# Patient Record
Sex: Female | Born: 1990 | Race: Black or African American | Hispanic: No | Marital: Married | Smoking: Current some day smoker
Health system: Southern US, Community
[De-identification: ages and names within clinical notes are randomized; demographics above are authoritative.]

## PROBLEM LIST (undated history)

## (undated) DIAGNOSIS — Z789 Other specified health status: Secondary | ICD-10-CM

## (undated) HISTORY — PX: NO PAST SURGERIES: SHX2092

---
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
## 2012-12-26 ENCOUNTER — Emergency Department: Payer: Self-pay | Admitting: Emergency Medicine

## 2013-08-03 ENCOUNTER — Emergency Department: Payer: Self-pay | Admitting: Emergency Medicine

## 2014-04-07 NOTE — L&D Delivery Note (Signed)
Delivery Note At 11:14 AM a viable female was delivered via Vaginal, Spontaneous Delivery (Presentation: Right Occiput Anterior).  APGAR: 9, 9; weight 5 lb 7.8 oz (2490 g).   Placenta status: Spontaneous.  Cord: 3 vessels with the following complications: None.    Anesthesia: Epidural  Episiotomy: None Lacerations:  None Suture Repair: None Est. Blood Loss (mL): 150  Mom to postpartum.  Baby to Couplet care / Skin to Skin.  Heatherly Stenner 08/26/2014, 4:15 AM

## 2014-06-29 ENCOUNTER — Emergency Department: Payer: Self-pay | Admitting: Emergency Medicine

## 2014-06-29 LAB — ED INFLUENZA
Influenza A By PCR: POSITIVE
Influenza B By PCR: NEGATIVE

## 2014-07-04 LAB — OB RESULTS CONSOLE GC/CHLAMYDIA
CHLAMYDIA, DNA PROBE: NEGATIVE
GC PROBE AMP, GENITAL: NEGATIVE

## 2014-07-04 LAB — OB RESULTS CONSOLE HEPATITIS B SURFACE ANTIGEN: HEP B S AG: NEGATIVE

## 2014-07-04 LAB — OB RESULTS CONSOLE ABO/RH

## 2014-07-04 LAB — OB RESULTS CONSOLE HIV ANTIBODY (ROUTINE TESTING): HIV: NONREACTIVE

## 2014-07-04 LAB — OB RESULTS CONSOLE VARICELLA ZOSTER ANTIBODY, IGG: Varicella: IMMUNE

## 2014-07-04 LAB — OB RESULTS CONSOLE RPR: RPR: NONREACTIVE

## 2014-07-04 LAB — OB RESULTS CONSOLE RUBELLA ANTIBODY, IGM: RUBELLA: IMMUNE

## 2014-08-24 ENCOUNTER — Inpatient Hospital Stay
Admission: EM | Admit: 2014-08-24 | Discharge: 2014-08-27 | DRG: 775 | Disposition: A | Discharge status: Home / Self Care | Payer: 59 | Attending: Obstetrics and Gynecology | Admitting: Obstetrics and Gynecology

## 2014-08-24 ENCOUNTER — Encounter: Payer: Self-pay | Admitting: *Deleted

## 2014-08-24 DIAGNOSIS — O42913 Preterm premature rupture of membranes, unspecified as to length of time between rupture and onset of labor, third trimester: Secondary | ICD-10-CM | POA: Diagnosis present

## 2014-08-24 DIAGNOSIS — O0933 Supervision of pregnancy with insufficient antenatal care, third trimester: Secondary | ICD-10-CM | POA: Diagnosis present

## 2014-08-24 DIAGNOSIS — O42013 Preterm premature rupture of membranes, onset of labor within 24 hours of rupture, third trimester: Principal | ICD-10-CM | POA: Diagnosis present

## 2014-08-24 DIAGNOSIS — D649 Anemia, unspecified: Secondary | ICD-10-CM | POA: Diagnosis not present

## 2014-08-24 DIAGNOSIS — Z3A36 36 weeks gestation of pregnancy: Secondary | ICD-10-CM | POA: Diagnosis present

## 2014-08-24 LAB — CBC
HCT: 28.4 % — ABNORMAL LOW (ref 35.0–47.0)
Hemoglobin: 9 g/dL — ABNORMAL LOW (ref 12.0–16.0)
MCH: 22.4 pg — ABNORMAL LOW (ref 26.0–34.0)
MCHC: 31.8 g/dL — ABNORMAL LOW (ref 32.0–36.0)
MCV: 70.4 fL — AB (ref 80.0–100.0)
PLATELETS: 233 10*3/uL (ref 150–440)
RBC: 4.04 MIL/uL (ref 3.80–5.20)
RDW: 14.7 % — ABNORMAL HIGH (ref 11.5–14.5)
WBC: 9.1 10*3/uL (ref 3.6–11.0)

## 2014-08-24 LAB — TYPE AND SCREEN
ABO/RH(D): O POS
Antibody Screen: NEGATIVE

## 2014-08-24 LAB — ABO/RH: ABO/RH(D): O POS

## 2014-08-24 MED ORDER — BUTORPHANOL TARTRATE 1 MG/ML IJ SOLN
1.0000 mg | INTRAMUSCULAR | Status: DC | PRN
  Administered 2014-08-24 – 2014-08-25 (×3): 1 mg via INTRAVENOUS

## 2014-08-24 MED ORDER — SODIUM CHLORIDE 0.9 % IV SOLN
2.0000 g | Freq: Once | INTRAVENOUS | Status: AC
  Administered 2014-08-24: 2 g via INTRAVENOUS

## 2014-08-24 MED ORDER — SODIUM CHLORIDE 0.9 % IV SOLN
INTRAVENOUS | Status: AC
  Administered 2014-08-24: 1 g via INTRAVENOUS
  Filled 2014-08-24: qty 1000

## 2014-08-24 MED ORDER — BUTORPHANOL TARTRATE 1 MG/ML IJ SOLN
INTRAMUSCULAR | Status: AC
  Administered 2014-08-24: 1 mg via INTRAVENOUS
  Filled 2014-08-24: qty 1

## 2014-08-24 MED ORDER — ONDANSETRON HCL 4 MG/2ML IJ SOLN: 4.0000 mg | Freq: Four times a day (QID) | INTRAMUSCULAR | Status: DC | PRN

## 2014-08-24 MED ORDER — LACTATED RINGERS IV SOLN
500.0000 mL | INTRAVENOUS | Status: DC | PRN
  Administered 2014-08-25: 500 mL via INTRAVENOUS

## 2014-08-24 MED ORDER — TERBUTALINE SULFATE 1 MG/ML IJ SOLN: 0.2500 mg | Freq: Once | INTRAMUSCULAR | Status: AC | PRN

## 2014-08-24 MED ORDER — SODIUM CHLORIDE 0.9 % IV SOLN
1.0000 g | INTRAVENOUS | Status: DC
  Administered 2014-08-24 – 2014-08-25 (×4): 1 g via INTRAVENOUS
  Filled 2014-08-24 (×6): qty 1000

## 2014-08-24 MED ORDER — OXYTOCIN 40 UNITS IN LACTATED RINGERS INFUSION - SIMPLE MED
INTRAVENOUS | Status: AC
  Administered 2014-08-24: 1 m[IU]/min via INTRAVENOUS
  Filled 2014-08-24: qty 1000

## 2014-08-24 MED ORDER — LACTATED RINGERS IV SOLN
INTRAVENOUS | Status: DC
  Administered 2014-08-24 (×2): via INTRAVENOUS
  Administered 2014-08-25: 1 mL via INTRAVENOUS
  Administered 2014-08-25: 11:00:00 via INTRAVENOUS

## 2014-08-24 MED ORDER — OXYTOCIN 40 UNITS IN LACTATED RINGERS INFUSION - SIMPLE MED
1.0000 m[IU]/min | INTRAVENOUS | Status: DC
  Administered 2014-08-24: 1 m[IU]/min via INTRAVENOUS

## 2014-08-24 MED ORDER — ACETAMINOPHEN 325 MG PO TABS: 650.0000 mg | ORAL_TABLET | ORAL | Status: DC | PRN

## 2014-08-24 MED ORDER — SODIUM CHLORIDE 0.9 % IV SOLN
INTRAVENOUS | Status: AC
  Administered 2014-08-24: 2 g via INTRAVENOUS
  Filled 2014-08-24: qty 2000

## 2014-08-24 MED ORDER — LIDOCAINE HCL (PF) 1 % IJ SOLN
30.0000 mL | INTRAMUSCULAR | Status: DC | PRN
  Filled 2014-08-24: qty 30

## 2014-08-24 MED ORDER — SODIUM CHLORIDE 0.9 % IV SOLN
INTRAVENOUS | Status: AC
  Filled 2014-08-24: qty 1000

## 2014-08-24 MED ORDER — SODIUM CHLORIDE 0.9 % IV SOLN: 1.0000 g | Freq: Four times a day (QID) | INTRAVENOUS | Status: DC

## 2014-08-24 NOTE — H&P (Signed)
OB History and Physical   HPI: [name] is a 24 y.o. G2P1001 at 6351w1d, unsure LMP (dated by 6729 week sono), with EDD of 09/20/2014, who presents with complaints of LOF since 10:00 a.m. This morning.  Denies contractions, vaginal bleeding. Notes good FM.   OBHx:  G1 - 2012.  SVD at term, female infant ~ [redacted] weeks gestation. No complications during labor or delivery.  G2 - current.  [facility] (began at ~ [redacted] weeks gestation).  Mild anemia in pregnancy. Cervix slightly shortened, without funneling.  GYNHx: no h/o abnormal paps, STIs . PMH:  History reviewed. No pertinent past medical history.  SurgHx:  History reviewed. No pertinent past surgical history.  Family Hx:  History reviewed. No pertinent family history.  SocialHx:  History   Social History  . Marital Status: Single    Spouse Name: N/A  . Number of Children: N/A  . Years of Education: N/A   Occupational History  . Not on file.   Social History Main Topics  . Smoking status: Former Smoker    Quit date: 06/24/2014  . Smokeless tobacco: Not on file  . Alcohol Use: No  . Drug Use: Yes    Special: Marijuana  . Sexual Activity: Yes   Other Topics Concern  . Not on file   Social History Narrative  . No narrative on file    Allergies:  No Known Allergies  Medications:  PNV 1 tab PO daily Ferrous sulfate 1 tab PO daily.  Physical Exam:  Blood pressure 120/67, pulse 91, temperature 98.1 F (36.7 C), temperature source Oral, resp. rate 16, height 5\' 5"  (1.651 m), weight 66.225 kg (146 lb).  Physical Exam  Constitutional: She is oriented to person, place, and time and well-developed, well-nourished, and in no distress.  HENT:  Head: Normocephalic and atraumatic.  Neck: Normal range of motion. Neck supple. No thyromegaly present.  Cardiovascular: Normal rate, regular rhythm and normal heart sounds.  Pulmonary/Chest: Effort normal and breath sounds normal.  Abdominal: Soft. There is no  tenderness. Gravid.      FHT: Baseline 135 bpm, accels present, no decels.       Toco: contractions irregular (q 3-10 min), with uterine irritability Genitourinary: Vagina normal and cervix normal. No vaginal discharge found.  Cervix: 2/80/c/-2/grossly ruptured (clear fluid) Musculoskeletal: Normal range of motion.  Neurological: She is alert and oriented to person, place, and time.  Skin: Skin is warm and dry. No rash noted.  Psychiatric: Affect normal.    Labs:  O+/-/HIV-/ND/NR/VZI/RNI/GC-/Cl- (07/04/2014)/GBS unknown  CBC    Component Value Date/Time   WBC 9.1 08/24/2014 1238   RBC 4.04 08/24/2014 1238   HGB 9.0* 08/24/2014 1238   HCT 28.4* 08/24/2014 1238   PLT 233 08/24/2014 1238   MCV 70.4* 08/24/2014 1238   MCH 22.4* 08/24/2014 1238   MCHC 31.8* 08/24/2014 1238   RDW 14.7* 08/24/2014 1238   Sickledex screening neg.   RGS 74 (wnl)  US (07/07/2014): EFW 60% (1323 g). EGA 29.2 weeks, normal anatomy. AFI 10.9 cm. Cervix slightly shortened at 2.6 cm, no funneling seen.  Placenta posterior, remote from cervix. Vertex presentation. Female fetus. FHR 145 bpm.   Assessment:  1. SIUP at 3951w1d 2. PPROM (clear fluid) 3. GBS status and 3rd trimester GC/Cl unknown (lab performed today in office).  [facility]. [facility]. H/o slightly shortened cervix without funneling.   Plan:  1. Admit to Labor and Delivery 2. Await for spontaneous  labor.  If no contractions after ~ 4 hours, can initiate Pitocin 3. Epidural if desired. Stadol for IV pain until epidural requested. 4. Ampicillin for GBS unknown status.  5. Admission labs   Hildred LaserAnika Mendell Bontempo, MD 08/24/2014 1:31 PM

## 2014-08-24 NOTE — Progress Notes (Addendum)
Intrapartum Progress Note  S: Patient comfortable. Denies complaints.   O: Blood pressure 120/67, pulse 91, temperature 98.1 F (36.7 C), temperature source Oral, resp. rate 16, height 5\' 5"  (1.651 m), weight 66.225 kg (146 lb). Gen App: NAD, comfortable Abdomen: soft, gravid FHT: 140 bpm, accels present, no decels.  Tocometer: contractions irregular, q 4-7 min Cervix: 2/75/c/-2/PPROM Extremities: Nontender, no edema.  Labs:  No new labs  Assessment:  1. SIUP at 7814w1d 2. PPROM 3. GBS unknown status 4. [facility]  Plan:  1.Patient unchanged after 4 hrs. Will initiate Pitocin 2. Continue Ampicillin prophylaxis for GBS unknown status.    Hildred LaserAnika Tarell Schollmeyer, MD 08/24/2014 6:03 PM

## 2014-08-25 ENCOUNTER — Inpatient Hospital Stay: Payer: 59 | Admitting: Anesthesiology

## 2014-08-25 LAB — RPR: RPR Ser Ql: NONREACTIVE

## 2014-08-25 MED ORDER — PRENATAL MULTIVITAMIN CH
1.0000 | ORAL_TABLET | Freq: Every day | ORAL | Status: DC
  Administered 2014-08-25 – 2014-08-27 (×3): 1 via ORAL
  Filled 2014-08-25 (×3): qty 1

## 2014-08-25 MED ORDER — LACTATED RINGERS IV SOLN: INTRAVENOUS | Status: DC

## 2014-08-25 MED ORDER — ONDANSETRON HCL 4 MG PO TABS: 4.0000 mg | ORAL_TABLET | ORAL | Status: DC | PRN

## 2014-08-25 MED ORDER — PHENYLEPHRINE 40 MCG/ML (10ML) SYRINGE FOR IV PUSH (FOR BLOOD PRESSURE SUPPORT)
80.0000 ug | PREFILLED_SYRINGE | INTRAVENOUS | Status: DC | PRN
Start: 2014-08-25 — End: 2014-08-25
  Filled 2014-08-25: qty 2

## 2014-08-25 MED ORDER — OXYTOCIN 40 UNITS IN LACTATED RINGERS INFUSION - SIMPLE MED: 62.5000 mL/h | INTRAVENOUS | Status: DC | PRN

## 2014-08-25 MED ORDER — ACETAMINOPHEN-CODEINE #3 300-30 MG PO TABS
1.0000 | ORAL_TABLET | Freq: Four times a day (QID) | ORAL | Status: DC | PRN
  Administered 2014-08-26: 2 via ORAL
  Administered 2014-08-26 – 2014-08-27 (×3): 1 via ORAL
  Filled 2014-08-25: qty 1
  Filled 2014-08-25: qty 2
  Filled 2014-08-25 (×2): qty 1

## 2014-08-25 MED ORDER — LANOLIN HYDROUS EX OINT: TOPICAL_OINTMENT | CUTANEOUS | Status: DC | PRN

## 2014-08-25 MED ORDER — SENNOSIDES-DOCUSATE SODIUM 8.6-50 MG PO TABS
2.0000 | ORAL_TABLET | ORAL | Status: DC
  Administered 2014-08-26 – 2014-08-27 (×2): 2 via ORAL
  Filled 2014-08-25 (×2): qty 2

## 2014-08-25 MED ORDER — SIMETHICONE 80 MG PO CHEW: 80.0000 mg | CHEWABLE_TABLET | ORAL | Status: DC | PRN

## 2014-08-25 MED ORDER — ZOLPIDEM TARTRATE 5 MG PO TABS: 5.0000 mg | ORAL_TABLET | Freq: Every evening | ORAL | Status: DC | PRN

## 2014-08-25 MED ORDER — BENZOCAINE-MENTHOL 20-0.5 % EX AERO: 1.0000 "application " | INHALATION_SPRAY | CUTANEOUS | Status: DC | PRN

## 2014-08-25 MED ORDER — BUTORPHANOL TARTRATE 1 MG/ML IJ SOLN
INTRAMUSCULAR | Status: AC
  Administered 2014-08-25: 1 mg via INTRAVENOUS
  Filled 2014-08-25: qty 1

## 2014-08-25 MED ORDER — BUPIVACAINE HCL (PF) 0.25 % IJ SOLN
INTRAMUSCULAR | Status: DC | PRN
  Administered 2014-08-25: 5 mL

## 2014-08-25 MED ORDER — FENTANYL 2.5 MCG/ML W/ROPIVACAINE 0.2% IN NS 100 ML EPIDURAL INFUSION (ARMC-ANES)
8.0000 mL/h | EPIDURAL | Status: DC
  Administered 2014-08-25: 9 mL/h via EPIDURAL

## 2014-08-25 MED ORDER — IBUPROFEN 800 MG PO TABS
800.0000 mg | ORAL_TABLET | Freq: Four times a day (QID) | ORAL | Status: DC
  Administered 2014-08-26 (×4): 800 mg via ORAL
  Filled 2014-08-25 (×4): qty 1

## 2014-08-25 MED ORDER — DIPHENHYDRAMINE HCL 25 MG PO CAPS: 25.0000 mg | ORAL_CAPSULE | Freq: Four times a day (QID) | ORAL | Status: DC | PRN

## 2014-08-25 MED ORDER — ONDANSETRON HCL 4 MG/2ML IJ SOLN: 4.0000 mg | INTRAMUSCULAR | Status: DC | PRN

## 2014-08-25 MED ORDER — DIPHENHYDRAMINE HCL 50 MG/ML IJ SOLN: 12.5000 mg | INTRAMUSCULAR | Status: DC | PRN

## 2014-08-25 MED ORDER — EPHEDRINE 5 MG/ML INJ
10.0000 mg | INTRAVENOUS | Status: DC | PRN
  Filled 2014-08-25: qty 2

## 2014-08-25 MED ORDER — IBUPROFEN 800 MG PO TABS
800.0000 mg | ORAL_TABLET | Freq: Four times a day (QID) | ORAL | Status: DC
  Administered 2014-08-25: 800 mg via ORAL
  Filled 2014-08-25: qty 1

## 2014-08-25 MED ORDER — FERROUS SULFATE 325 (65 FE) MG PO TABS
325.0000 mg | ORAL_TABLET | Freq: Two times a day (BID) | ORAL | Status: DC
  Administered 2014-08-26 – 2014-08-27 (×3): 325 mg via ORAL
  Filled 2014-08-25 (×3): qty 1

## 2014-08-25 MED ORDER — SODIUM CHLORIDE 0.9 % IV SOLN
INTRAVENOUS | Status: AC
  Administered 2014-08-25: 1 g via INTRAVENOUS
  Filled 2014-08-25: qty 1000

## 2014-08-25 MED ORDER — LIDOCAINE-EPINEPHRINE (PF) 1.5 %-1:200000 IJ SOLN
INTRAMUSCULAR | Status: DC | PRN
  Administered 2014-08-25: 3 mL

## 2014-08-25 MED ORDER — FENTANYL 2.5 MCG/ML W/ROPIVACAINE 0.2% IN NS 100 ML EPIDURAL INFUSION (ARMC-ANES)
EPIDURAL | Status: AC
  Administered 2014-08-25: 9 mL/h via EPIDURAL
  Filled 2014-08-25: qty 100

## 2014-08-25 MED ORDER — AMPICILLIN SODIUM 1 G IJ SOLR
INTRAMUSCULAR | Status: AC
  Administered 2014-08-25: 1 g via INTRAVENOUS
  Filled 2014-08-25: qty 1000

## 2014-08-25 MED ORDER — WITCH HAZEL-GLYCERIN EX PADS: 1.0000 "application " | MEDICATED_PAD | CUTANEOUS | Status: DC | PRN

## 2014-08-25 MED ORDER — DIBUCAINE 1 % RE OINT: 1.0000 "application " | TOPICAL_OINTMENT | RECTAL | Status: DC | PRN

## 2014-08-25 NOTE — Progress Notes (Signed)
Intrapartum Progress Note  S: Patient doing well.  Notes mild pain with contractions.  O: Blood pressure 127/72, pulse 89, temperature 97.5 F (36.4 C), temperature source Oral, resp. rate 20, height 5\' 5"  (1.651 m), weight 66.225 kg (146 lb). Gen App: NAD, comfortable Abdomen: soft, gravid FHT: 120s, accels present, 2 late decelerations noted from baseline to 90s lasting 1 min with good recovery.  No variable decels. Good variability.  Tocometer:  Ctx q 2-3 min Cervix: 4/90/-1/PPROM Extremities: Nontender, no edema.  Pitocin: 14 mIU  Labs:  Lab Results  Component Value Date   WBC 9.1 08/24/2014   HGB 9.0* 08/24/2014   HCT 28.4* 08/24/2014   MCV 70.4* 08/24/2014   PLT 233 08/24/2014    Assessment:  1: SIUP at 4753w2d 2. GBS unknown status 3. Active labor 4. Category II tracing (however unsure if due to maternal movement).   Plan:  1. Continue active management of labor with pitocin.  Hold for tachysystole or fetal intolerance. 2. . IUPC and FSC placed.  3. Continue Ampicillin for GBS unknown status   Hildred LaserAnika Maninder Deboer, MD 08/25/2014 9:23 AM

## 2014-08-25 NOTE — Progress Notes (Signed)
Intrapartum Progress Note  S: Patient doing well.  Notes mild pain with contractions.  O: Blood pressure 110/42, pulse 54, temperature 98.1 F (36.7 C), temperature source Oral, resp. rate 20, height 5\' 5"  (1.651 m), weight 66.225 kg (146 lb). Gen App: NAD, comfortable Abdomen: soft, gravid FHT: 130s, accels present, few early decelerations noted.  No late or variable decels. Good variability.  Tocometer:  Ctx q 3-4 min Cervix: 3/80/-3/PPROM Extremities: Nontender, no edema.  Pitocin: 14 mIU  Labs:  Lab Results  Component Value Date   WBC 9.1 08/24/2014   HGB 9.0* 08/24/2014   HCT 28.4* 08/24/2014   MCV 70.4* 08/24/2014   PLT 233 08/24/2014    Assessment:  1: SIUP at 4911w2d 2. GBS unknown status 3. Latent labor 4. Anemia of  pregnancy  Plan:  1. Continue active management of labor with pitocin 2. Will likely place IUPC by next cervical check if no change.  3. Continue Ampicillin for GBS unknown status 4. Will need iron supplementation therapy postpartum.   Hildred LaserAnika Laurna Shetley, MD 08/25/2014 8:21 AM

## 2014-08-25 NOTE — Anesthesia Procedure Notes (Addendum)
Epidural Patient location during procedure: OB Start time: 08/25/2014 9:36 AM End time: 08/25/2014 9:50 AM  Staffing Resident/CRNA: Emillie Chasen Performed by: resident/CRNA   Preanesthetic Checklist Completed: patient identified, site marked, surgical consent, pre-op evaluation, IV checked, risks and benefits discussed and monitors and equipment checked  Epidural Patient position: sitting Prep: Betadine Patient monitoring: heart rate, continuous pulse ox and blood pressure Approach: midline Location: L4-L5 Injection technique: LOR saline  Needle:  Needle type: Tuohy  Needle gauge: 18 G Needle length: 9 cm Needle insertion depth: 5 cm Catheter type: closed end flexible Catheter size: 20 Guage Catheter at skin depth: 10 cm Test dose: negative and 1.5% lidocaine with Epi 1:200 K  Assessment Events: blood not aspirated, injection not painful, no injection resistance, negative IV test and no paresthesia  Additional Notes   Patient tolerated the insertion well without complications.Reason for block:procedure for pain

## 2014-08-25 NOTE — Lactation Note (Signed)
This note was copied from the chart of Boy Glo HerringHeather Hardigree. Lactation Consultation Note  Patient Name: Boy Glo HerringHeather Pyle ZOXWR'UToday's Date: 08/25/2014     Maternal Data    Feeding Feeding Type: Breast Fed Length of feed: 30 min  LATCH Score/Interventions Latch: Repeated attempts needed to sustain latch, nipple held in mouth throughout feeding, stimulation needed to elicit sucking reflex. Intervention(s): Adjust position;Assist with latch  Audible Swallowing: A few with stimulation Intervention(s): Skin to skin  Type of Nipple: Everted at rest and after stimulation  Comfort (Breast/Nipple): Soft / non-tender     Hold (Positioning): Assistance needed to correctly position infant at breast and maintain latch. Intervention(s): Breastfeeding basics reviewed;Support Pillows;Position options;Skin to skin  LATCH Score: 7  Lactation Tools Discussed/Used     Consult Status      Dyann KiefMarsha D Nyema Hachey 08/25/2014, 4:32 PM

## 2014-08-25 NOTE — Anesthesia Preprocedure Evaluation (Signed)
Anesthesia Evaluation  Patient identified by MRN, date of birth, ID band Patient awake    Reviewed: Allergy & Precautions, H&P , Patient's Chart, lab work & pertinent test results  History of Anesthesia Complications Negative for: history of anesthetic complications  Airway Mallampati: II       Dental no notable dental hx.    Pulmonary neg pulmonary ROS, former smoker,    Pulmonary exam normal       Cardiovascular negative cardio ROS Normal cardiovascular exam    Neuro/Psych negative neurological ROS  negative psych ROS   GI/Hepatic negative GI ROS, Neg liver ROS,   Endo/Other  negative endocrine ROS  Renal/GU negative Renal ROS  negative genitourinary   Musculoskeletal   Abdominal   Peds  Hematology negative hematology ROS (+)   Anesthesia Other Findings   Reproductive/Obstetrics (+) Pregnancy                             Anesthesia Physical Anesthesia Plan  ASA: II  Anesthesia Plan: Epidural   Post-op Pain Management:    Induction:   Airway Management Planned:   Additional Equipment:   Intra-op Plan:   Post-operative Plan:   Informed Consent: I have reviewed the patients History and Physical, chart, labs and discussed the procedure including the risks, benefits and alternatives for the proposed anesthesia with the patient or authorized representative who has indicated his/her understanding and acceptance.     Plan Discussed with: Anesthesiologist  Anesthesia Plan Comments:         Anesthesia Quick Evaluation

## 2014-08-26 LAB — CBC
HCT: 28.2 % — ABNORMAL LOW (ref 35.0–47.0)
HEMOGLOBIN: 9.1 g/dL — AB (ref 12.0–16.0)
MCH: 22.4 pg — AB (ref 26.0–34.0)
MCHC: 32.2 g/dL (ref 32.0–36.0)
MCV: 69.6 fL — ABNORMAL LOW (ref 80.0–100.0)
Platelets: 210 10*3/uL (ref 150–440)
RBC: 4.06 MIL/uL (ref 3.80–5.20)
RDW: 14.5 % (ref 11.5–14.5)
WBC: 11.2 10*3/uL — ABNORMAL HIGH (ref 3.6–11.0)

## 2014-08-26 MED ORDER — IBUPROFEN 800 MG PO TABS
800.0000 mg | ORAL_TABLET | Freq: Four times a day (QID) | ORAL | Status: DC
  Administered 2014-08-27: 800 mg via ORAL
  Filled 2014-08-26: qty 1

## 2014-08-26 NOTE — Progress Notes (Signed)
Post Partum Day #1 Subjective: no complaints, up ad lib, voiding and tolerating PO  Objective: Blood pressure 116/91, pulse 63, temperature 98.1 F (36.7 C), temperature source Oral, resp. rate 18, height 5\' 5"  (1.651 m), weight 66.225 kg (146 lb), SpO2 100 %, unknown if currently breastfeeding.  Physical Exam:  General: alert Lochia: appropriate Uterine Fundus: firm Incision: none DVT Evaluation: No evidence of DVT seen on physical exam. No cords or calf tenderness. No significant calf/ankle edema.   Recent Labs  08/24/14 1238 08/26/14 0603  HGB 9.0* 9.1*  HCT 28.4* 28.2*    Assessment/Plan: Plan for discharge tomorrow, Breastfeeding, Circumcision prior to discharge and Contraception Depo Provera  Anemia - mild anemia of pregnancy. Asymptomatic. Will treat with PO iron supplementation as outpatient.    LOS: 2 days   Krista Rich 08/26/2014, 1:43 PM

## 2014-08-26 NOTE — Anesthesia Postprocedure Evaluation (Signed)
  Anesthesia Post-op Note  Patient: Krista Rich  Procedure(s) Performed: CLE  Anesthesia type:Epidural  Patient location: PACU  Post pain: Pain level controlled  Post assessment: Post-op Vital signs reviewed, Patient's Cardiovascular Status Stable, Respiratory Function Stable, Patent Airway and No signs of Nausea or vomiting  Post vital signs: Reviewed and stable  Last Vitals:  Filed Vitals:   08/26/14 0738  BP: 118/67  Pulse: 69  Temp: 36.8 C  Resp:     Level of consciousness: awake, alert  and patient cooperative  Complications: No apparent anesthesia complications

## 2014-08-27 MED ORDER — DOCUSATE SODIUM 100 MG PO CAPS: 100.0000 mg | ORAL_CAPSULE | Freq: Two times a day (BID) | ORAL | Status: DC | PRN

## 2014-08-27 MED ORDER — IBUPROFEN 800 MG PO TABS: 800.0000 mg | ORAL_TABLET | Freq: Four times a day (QID) | ORAL | Status: DC

## 2014-08-27 NOTE — Progress Notes (Signed)
Pt discharged home with infant.  Discharge instructions and follow up appointment given to and reviewed with pt.  Pt verbalized understanding.  Escorted by auxillary. 

## 2014-08-27 NOTE — Discharge Instructions (Signed)
Vaginal Delivery, Care After  Refer to this sheet in the next few weeks. These discharge instructions provide you with information on caring for yourself after delivery. Your caregiver may also give you specific instructions. Your treatment has been planned according to the most current medical practices available, but problems sometimes occur. Call your caregiver if you have any problems or questions after you go home. HOME CARE INSTRUCTIONS  Take over-the-counter or prescription medicines only as directed by your caregiver or pharmacist.  Do not drink alcohol, especially if you are breastfeeding or taking medicine to relieve pain.  Do not chew or smoke tobacco.  Do not use illegal drugs.  Continue to use good perineal care. Good perineal care includes:  Wiping your perineum from front to back.  Keeping your perineum clean.  Do not use tampons or douche until your caregiver says it is okay.  Shower, wash your hair, and take tub baths as directed by your caregiver.  Wear a well-fitting bra that provides breast support.  Eat healthy foods.  Drink enough fluids to keep your urine clear or pale yellow.  Eat high-fiber foods such as whole grain cereals and breads, brown rice, beans, and fresh fruits and vegetables every day. These foods may help prevent or relieve constipation.  Follow your caregiver's recommendations regarding resumption of activities such as climbing stairs, driving, lifting, exercising, or traveling.  Talk to your caregiver about resuming sexual activities. Resumption of sexual activities is dependent upon your risk of infection, your rate of healing, and your comfort and desire to resume sexual activity.  Try to have someone help you with your household activities and your newborn for at least a few days after you leave the hospital.  Rest as much as possible. Try to rest or take a nap when your newborn is sleeping.  Increase your activities gradually.  Keep  all of your scheduled postpartum appointments. It is very important to keep your scheduled follow-up appointments. At these appointments, your caregiver will be checking to make sure that you are healing physically and emotionally. SEEK MEDICAL CARE IF:   You are passing large clots from your vagina. Save any clots to show your caregiver.  You have a foul smelling discharge from your vagina.  You have trouble urinating.  You are urinating frequently.  You have pain when you urinate.  You have a change in your bowel movements.  You have increasing redness, pain, or swelling near your vaginal incision (episiotomy) or vaginal tear.  You have pus draining from your episiotomy or vaginal tear.  Your episiotomy or vaginal tear is separating.  You have painful, hard, or reddened breasts.  You have a severe headache.  You have blurred vision or see spots.  You feel sad or depressed.  You have thoughts of hurting yourself or your newborn.  You have questions about your care, the care of your newborn, or medicines.  You are dizzy or light-headed.  You have a rash.  You have nausea or vomiting.  You were breastfeeding and have not had a menstrual period within 12 weeks after you stopped breastfeeding.  You are not breastfeeding and have not had a menstrual period by the 12th week after delivery.  You have a fever. SEEK IMMEDIATE MEDICAL CARE IF:   You have persistent pain.  You have chest pain.  You have shortness of breath.  You faint.  You have leg pain.  You have stomach pain.  Your vaginal bleeding saturates two or more sanitary  pads in 1 hour. MAKE SURE YOU:   Understand these instructions.  Will watch your condition.  Will get help right away if you are not doing well or get worse. Document Released: 03/21/2000 Document Revised: 08/08/2013 Document Reviewed: 11/19/2011 [facility] Patient Information 2015 [place], [place]. This information is not intended to  replace advice given to you by your health care provider. Make sure you discuss any questions you have with your health care provider.  General Postpartum Discharge Instructions  Do not drink alcohol or take tranquilizers.  Do not take medicine that has not been prescribed by your doctor.  Take showers instead of baths until your doctor gives you permission to take baths.  No sexual intercourse or placement of anything in the vagina for 6 weeks or as instructed by your doctor. Only take prescription or over-the-counter medicines  for pain, discomfort, or fever as directed by your doctor.    Call the office or go to the Emergency Room if:  You feel sick to your stomach (nauseous).  You start to throw up (vomit).  You have trouble eating or drinking.  You have an oral temperature above 101.  You have constipation that is not helped by adjusting diet or increasing fluid intake. Pain medicines are a common cause of constipation.  You have foul smelling vaginal discharge or odor.  You have bleeding requiring changing more than 1 pad per hour. You have any other concerns.  SEEK IMMEDIATE MEDICAL CARE IF:  You have persistent dizziness.  You have difficulty breathing or shortness of breath.  You have an oral temperature above 102.5, not controlled by medicine.

## 2014-08-27 NOTE — Discharge Summary (Addendum)
Obstetric Discharge Summary Reason for Admission: rupture of membranes (premature, preterm) Prenatal Procedures: none Intrapartum Procedures: spontaneous vaginal delivery Postpartum Procedures: none Complications-Operative and Postpartum: none   CBC Latest Ref Rng 08/26/2014 08/24/2014  WBC 3.6 - 11.0 K/uL 11.2(H) 9.1  Hemoglobin 12.0 - 16.0 g/dL 1.6(X9.1(L) 0.9(U9.0(L)  Hematocrit 35.0 - 47.0 % 28.2(L) 28.4(L)  Platelets 150 - 440 K/uL 210 233    Physical Exam:  General: alert and no distress Lochia: appropriate Uterine Fundus: firm Incision: none DVT Evaluation: No evidence of DVT seen on physical exam.  Discharge Diagnoses: PPROM, delivered  Discharge Information: Date: 08/27/2014 Activity: pelvic rest x 6 weeks Diet: routine Medications: PNV, Ibuprofen, Colace and Iron Condition: stable Instructions: refer to practice specific booklet Discharge to: home   Newborn Data: Live born female  Birth Weight: 5 lb 7.8 oz (2490 g) APGAR: 9, 9  Home with mother.  Hildred LaserCHERRY, Ki Luckman 08/27/2014, 12:36 PM

## 2014-10-05 ENCOUNTER — Encounter: Payer: Self-pay | Admitting: Obstetrics and Gynecology

## 2014-10-05 ENCOUNTER — Ambulatory Visit (INDEPENDENT_AMBULATORY_CARE_PROVIDER_SITE_OTHER): Payer: 59 | Admitting: Obstetrics and Gynecology

## 2014-10-05 DIAGNOSIS — D508 Other iron deficiency anemias: Secondary | ICD-10-CM

## 2014-10-05 DIAGNOSIS — O0932 Supervision of pregnancy with insufficient antenatal care, second trimester: Secondary | ICD-10-CM

## 2014-10-05 DIAGNOSIS — O42919 Preterm premature rupture of membranes, unspecified as to length of time between rupture and onset of labor, unspecified trimester: Secondary | ICD-10-CM

## 2014-10-05 MED ORDER — MEDROXYPROGESTERONE ACETATE 150 MG/ML IM SUSP: 150.0000 mg | INTRAMUSCULAR | Status: DC

## 2014-10-06 ENCOUNTER — Ambulatory Visit: Payer: 59

## 2014-10-08 DIAGNOSIS — O0932 Supervision of pregnancy with insufficient antenatal care, second trimester: Secondary | ICD-10-CM | POA: Insufficient documentation

## 2014-10-08 DIAGNOSIS — O42919 Preterm premature rupture of membranes, unspecified as to length of time between rupture and onset of labor, unspecified trimester: Secondary | ICD-10-CM | POA: Insufficient documentation

## 2014-10-08 NOTE — Progress Notes (Signed)
Subjective:     Krista PlumberHeather I Rich is a 24 y.o. 15P2002 female who presents for a postpartum visit. She is 6 weeks postpartum following a spontaneous vaginal delivery. I have fully reviewed the prenatal and intrapartum course. The delivery was at 36.5 gestational weeks. Outcome: spontaneous vaginal delivery. Anesthesia: epidural. Postpartum course has been uncomplicated. Baby's course has been uncomplicated. Baby is feeding by bottle. Bleeding no bleeding. Bowel function is normal. Bladder function is normal. Patient is not sexually active. Contraception method desired is Depo-Provera injections. Postpartum depression screening: negative.  Has already resumed some work (from home), but request letter to resume full time.  Has not been taking iron supplements for anemia.    The following portions of the patient's history were reviewed and updated as appropriate: allergies, current medications, past family history, past medical history, past social history, past surgical history and problem list.  Review of Systems A comprehensive review of systems was negative.   Objective:    BP 107/61 mmHg  Pulse 72  Wt 123 lb 12.8 oz (56.155 kg)  General:  alert and no distress   Breasts:  inspection negative, no nipple discharge or bleeding, no masses or nodularity palpable  Lungs: clear to auscultation bilaterally  Heart:  regular rate and rhythm, S1, S2 normal, no murmur, click, rub or gallop  Abdomen: soft, non-tender; bowel sounds normal; no masses,  no organomegaly   Vulva:  normal  Vagina: normal vagina, no discharge, exudate, lesion, or erythema  Cervix:  multiparous appearance, no cervical motion tenderness and no lesions  Corpus: normal size, contour, position, consistency, mobility, non-tender  Adnexa:  normal adnexa and no mass, fullness, tenderness  Rectal Exam: Not performed.    Lab Review:  CBC Latest Ref Rng 08/26/2014 08/24/2014  WBC 3.6 - 11.0 K/uL 11.2(H) 9.1  Hemoglobin 12.0 - 16.0 g/dL  1.6(X9.1(L) 0.9(U9.0(L)  Hematocrit 35.0 - 47.0 % 28.2(L) 28.4(L)  Platelets 150 - 440 K/uL 210 233      Assessment:   Routine postpartum exam. Pap smear not done at today's visit.   Plan:   1. Contraception: Depo-Provera injections 2. To check Hgb postpartum.  3. Follow up in: 4 months for routine annual exam.  4. Follow up in 1 day to receive Depo Provera injection.    Hildred LaserAnika Harrol Novello, MD [facility]

## 2015-02-01 ENCOUNTER — Encounter: Payer: 59 | Admitting: Obstetrics and Gynecology

## 2015-08-05 ENCOUNTER — Emergency Department
Admission: EM | Admit: 2015-08-05 | Discharge: 2015-08-05 | Disposition: A | Discharge status: Home / Self Care | Payer: 59 | Attending: Emergency Medicine | Admitting: Emergency Medicine

## 2015-08-05 ENCOUNTER — Encounter: Payer: Self-pay | Admitting: Emergency Medicine

## 2015-08-05 DIAGNOSIS — F1290 Cannabis use, unspecified, uncomplicated: Secondary | ICD-10-CM | POA: Diagnosis not present

## 2015-08-05 DIAGNOSIS — K0889 Other specified disorders of teeth and supporting structures: Secondary | ICD-10-CM

## 2015-08-05 DIAGNOSIS — K011 Impacted teeth: Secondary | ICD-10-CM | POA: Diagnosis not present

## 2015-08-05 DIAGNOSIS — Z79899 Other long term (current) drug therapy: Secondary | ICD-10-CM | POA: Insufficient documentation

## 2015-08-05 DIAGNOSIS — Z87891 Personal history of nicotine dependence: Secondary | ICD-10-CM | POA: Insufficient documentation

## 2015-08-05 DIAGNOSIS — K047 Periapical abscess without sinus: Secondary | ICD-10-CM | POA: Diagnosis not present

## 2015-08-05 MED ORDER — IBUPROFEN 800 MG PO TABS: 800.0000 mg | ORAL_TABLET | Freq: Three times a day (TID) | ORAL | Status: DC | PRN

## 2015-08-05 MED ORDER — LIDOCAINE-EPINEPHRINE 2 %-1:100000 IJ SOLN
1.7000 mL | Freq: Once | INTRAMUSCULAR | Status: AC
  Administered 2015-08-05: 1.7 mL
  Filled 2015-08-05: qty 1.7

## 2015-08-05 MED ORDER — TRAMADOL HCL 50 MG PO TABS: 50.0000 mg | ORAL_TABLET | Freq: Two times a day (BID) | ORAL | Status: DC

## 2015-08-05 MED ORDER — PENICILLIN V POTASSIUM 500 MG PO TABS: 500.0000 mg | ORAL_TABLET | Freq: Four times a day (QID) | ORAL | Status: DC

## 2015-08-05 MED ORDER — LIDOCAINE VISCOUS 2 % MT SOLN: 10.0000 mL | Freq: Four times a day (QID) | OROMUCOSAL | Status: DC | PRN

## 2015-08-05 NOTE — Discharge Instructions (Signed)
Impacted Molar Molars are the teeth in the back of your mouth. When they push out from the gum and grow (erupt), they can become trapped inside the gum, or they may only partially come through the gum surface (impacted). Molars erupt at different times in life. The first set of molars usually erupts around 37-24 years of age. The second set of molars typically erupts around 89-26 years of age. The third set of molars usually erupts around 53-26 years of age. This set of molars is often referred to as wisdom teeth. Wisdom teeth often become impacted, but any molar or set of molars can become impacted. Impacted molars may increase the risk of developing:   Infection.  Damage to nearby teeth.  Growth of fluid-filled sacs (cysts).  Long-lasting (chronic) discomfort.  Inflammation of the surrounding gum tissue (pericoronitis). CAUSES Common causes of this condition include having crowded teeth or a small mouth. This means that there may not be space for the molar to grow into. Other causes include a cyst or a mass (tumor). SYMPTOMS Symptoms of this condition include:  Pain.  Swelling, redness, or inflammation near the impacted tooth or teeth.  A stiff jaw.  Bad breath.  A gap between the teeth.  Difficulty opening your mouth.  A headache or jaw ache.  Swollen lymph nodes.  A bad taste in your mouth. In some cases, there are no symptoms. DIAGNOSIS This condition can be diagnosed with an oral exam and X-rays.  TREATMENT This condition is often treated by removing (extracting) the impacted molar or molars. Other treatment options include:  A procedure to remove the gum tissue that covers the impacted molar.  Repositioning the teeth so there is room for the molar to come through. This may be done with orthodontic appliances, such as braces.  Antibiotic medicines, if your impacted molar or set of impacted molars has become infected. Treatment may not be needed if you do not have  any symptoms. Talk with your health care provider about what is best for you. HOME CARE INSTRUCTIONS  Take medicines only as directed by your health care provider.  If you were prescribed antibiotic medicine, finish all of it even if you start to feel better.  If directed, apply ice to the painful area:  Put ice in a plastic bag.  Place a towel between your skin and the bag.  Leave the ice on for 20 minutes, 2-3 times a day.  Keep all follow-up visits as directed by your health care provider. This is important.  Your health care provider may recommend a salt-water rinse or give you an antibacterial solution to help with pain, infection, or inflammation. Rinse your mouth as directed by your health care provider.  You can make a salt-water rinse by mixing one teaspoon of salt in two cups of warm water. SEEK MEDICAL CARE IF:  You have a fever.  Your symptoms get worse.  Your pain is not controlled with medicine.  You have new:  Facial swelling.  Swelling along your gums.  You have difficulty opening your mouth.  You have difficulty swallowing.  You have new symptoms.   This information is not intended to replace advice given to you by your health care provider. Make sure you discuss any questions you have with your health care provider.   Document Released: 11/20/2010 Document Revised: 08/08/2014 Document Reviewed: 03/20/2014 Elsevier Interactive Patient Education [employer]   Use the pain medicines as needed. Follow-up with one of the dental  clinics listed below. Use the antibiotic as needed for dental infection or abscess as discussed.   OPTIONS FOR DENTAL FOLLOW UP CARE  [place] Department of Health and Human Services - Local Safety Net Dental Clinics TripDoors.comhttp://www.ncdhhs.gov/dph/oralhealth/services/safetynetclinics.htm   [facility] [phone])  [facility] [phone])  [facility] [phone])  [facility]  [facility] [phone])  [facility] [phone]) This clinic caters to the indigent population and is on a lottery system. Location: [employer] [address] [facility], [address], [place] [facility] Hours: Wednesdays from 6pm - 9pm, patients seen by a lottery system. For dates, call or go to [email or web address] Services: Cleanings, fillings and simple extractions. Payment Options: DENTAL WORK IS FREE OF CHARGE. Bring proof of income or support. Best way to get seen: Arrive at 5:15 pm - this is a lottery, NOT first come/first serve, so arriving earlier will not increase your chances of being seen.     [facility] [phone] Select option 1 for emergencies   Location: [facility] [place], [address], [place] [facility] Hours: No walk-ins accepted - call the day before to schedule an appointment. Check in times are 9:30 am and 1:30 pm. Services: Simple extractions, temporary fillings, pulpectomy/pulp debridement, uncomplicated abscess drainage. Payment Options: PAYMENT IS DUE AT THE TIME OF SERVICE.  Fee is usually $100-200, additional surgical procedures (e.g. abscess drainage) may be extra. Cash, checks, Visa/MasterCard accepted.  Can file Medicaid if patient is covered for dental - patient should call case worker to check. No discount for [facility] patients. Best way to get seen: MUST call the day before and get onto the schedule. Can usually be seen the next 1-2 days. No walk-ins accepted.     [facility] [phone]   Location: [facility], [address], [place] Clinic Hours: M, W, Th, F 8am or 1:30pm, Tues 9a or 1:30 - first come/first served. Services: Simple extractions, temporary fillings, uncomplicated abscess drainage.  You do not need to be an [place] resident. Payment Options: PAYMENT IS DUE AT THE TIME OF  SERVICE. Dental insurance, otherwise sliding scale - bring proof of income or support. Depending on income and treatment needed, cost is usually $50-200. Best way to get seen: Arrive early as it is first come/first served.     [facility] [phone]   Location: [address] Clinic Hours: Mon-Thu 8a-5p Services: Most basic dental services including extractions and fillings. Payment Options: PAYMENT IS DUE AT THE TIME OF SERVICE. Sliding scale, up to 50% off - bring proof if income or support. Medicaid with dental option accepted. Best way to get seen: Call to schedule an appointment, can usually be seen within 2 weeks OR they will try to see walk-ins - show up at 8a or 2p (you may have to wait).     [facility] [phone] [place] RESIDENTS ONLY   Location: [facility], [address], [place], [place] [id number] Clinic Hours: By appointment only. Monday - Thursday 8am-5pm, Friday 8am-12pm Services: Cleanings, fillings, extractions. Payment Options: PAYMENT IS DUE AT THE TIME OF SERVICE. Cash, Visa or MasterCard. Sliding scale - $30 minimum per service. Best way to get seen: Come in to office, complete packet and make an appointment - need proof of income or support monies for each household member and proof of [place] residence. Usually takes about a month to  get in.     [facility] [phone]   Location: [address] [facility] Hours: Walk-in Urgent Care Dental Services are offered Monday-Friday mornings only. The numbers of emergencies accepted daily is limited to the number of providers available. Maximum 15 - Mondays, Wednesdays & Thursdays Maximum 10 - Tuesdays & Fridays Services: You do not need to be a [facility] resident to be seen for a dental emergency. Emergencies are defined as pain, swelling, abnormal bleeding,  or dental trauma. Walkins will receive x-rays if needed. NOTE: Dental cleaning is not an emergency. Payment Options: PAYMENT IS DUE AT THE TIME OF SERVICE. Minimum co-pay is $40.00 for uninsured patients. Minimum co-pay is $3.00 for Medicaid with dental coverage. Dental Insurance is accepted and must be presented at time of visit. Medicare does not cover dental. Forms of payment: Cash, credit card, checks. Best way to get seen: If not previously registered with the clinic, walk-in dental registration begins at 7:15 am and is on a first come/first serve basis. If previously registered with the clinic, call to make an appointment.     [facility] [phone] [place] RESIDENTS ONLY   Location: [address] [place], [place] Clinic Hours: Mon-Thu 10a-2p Services: Extractions only! Payment Options: FREE (donations accepted) - bring proof of income or support Best way to get seen: Call and schedule an appointment OR come at 8am on the 1st Monday of every month (except for holidays) when it is first come/first served.     [facility] [phone]   Location: [address] [place], [place] Clinic Hours: Friday mornings Services, Payment Options, Best way to get seen: Call for info

## 2015-08-05 NOTE — ED Notes (Signed)
Patient presents to the ED with severe left sided tooth pain x 2 days.  Patient states, " I think my wisdom teeth are coming in."  Patient is in no obvious distress at this time.

## 2015-08-05 NOTE — ED Notes (Addendum)
Patient states that her wisdom teeth are coming in and she is having bad pain on the right lower side. Patient states that this has been going on for about 1 week. Patient has been using oralgel without relief. Patient denies running fevers.

## 2015-08-05 NOTE — ED Provider Notes (Signed)
[facility] Emergency Department Provider Note ____________________________________________  Time seen: 1241  I have reviewed the triage vital signs and the nursing notes.  HISTORY  Chief Complaint  Dental Pain  HPI Krista Rich is a 25 y.o. female positions to the ED for evaluation of pain to the left lower jaw for the last week. She does have a history of partially erupted wisdom teeth on the lower jaw bilaterally. She has acquired previously about oral surgery extracting the teeth but she is currently without dental coverage. She otherwise denies any interim fevers, chills, sweats. She has not been aware of any fevers, chills, gum swelling or purulent drainage. She presents here with increasing pain that has not responded to high-dose ibuprofen at home.She reports the pain at a 9/10 in triage.  History reviewed. No pertinent past medical history.  Patient Active Problem List   Diagnosis Date Noted  . Preterm PROM, delivered, current hospitalization 10/08/2014  . Late prenatal care affecting pregnancy in second trimester, antepartum 10/08/2014  . Labor and delivery, indication for care 08/24/2014    History reviewed. No pertinent past surgical history.  Current Outpatient Rx  Name  Route  Sig  Dispense  Refill  . ibuprofen (ADVIL,MOTRIN) 800 MG tablet   Oral   Take 1 tablet (800 mg total) by mouth every 8 (eight) hours as needed.   30 tablet   0   . lidocaine (XYLOCAINE) 2 % solution   Mouth/Throat   Use as directed 10 mLs in the mouth or throat every 6 (six) hours as needed for mouth pain.   100 mL   0   . medroxyPROGESTERone (DEPO-PROVERA) 150 MG/ML injection   Intramuscular   Inject 1 mL (150 mg total) into the muscle every 3 (three) months.   1 mL   4   . penicillin v potassium (VEETID) 500 MG tablet   Oral   Take 1 tablet (500 mg total) by mouth 4 (four) times daily.   40 tablet   0   . traMADol (ULTRAM) 50 MG tablet   Oral   Take 1  tablet (50 mg total) by mouth 2 (two) times daily.   10 tablet   0     Allergies Review of patient's allergies indicates no known allergies.  Family History  Problem Relation Age of Onset  . Hyperlipidemia Mother     Social History Social History  Substance Use Topics  . Smoking status: Former Smoker    Quit date: 06/24/2014  . Smokeless tobacco: None  . Alcohol Use: No   Review of Systems  Constitutional: Negative for fever. ENT: Negative for sore throat. Dental pain to the right lower jaw as above. Gastrointestinal: Negative for abdominal pain, vomiting and diarrhea. Musculoskeletal: Negative for back pain. Reports jaw pain as above Skin: Negative for rash. Neurological: Negative for headaches, focal weakness or numbness. ____________________________________________  PHYSICAL EXAM:  VITAL SIGNS: ED Triage Vitals  Enc Vitals Group     BP 08/05/15 1106 119/67 mmHg     Pulse Rate 08/05/15 1104 58     Resp 08/05/15 1104 18     Temp 08/05/15 1104 98.4 F (36.9 C)     Temp Source 08/05/15 1104 Oral     SpO2 08/05/15 1104 100 %     Weight 08/05/15 1104 110 lb (49.896 kg)     Height 08/05/15 1104 5\' 5"  (1.651 m)     Head Cir --      Peak Flow --  Pain Score 08/05/15 1105 9     Pain Loc --      Pain Edu? --      Excl. in GC? --    Constitutional: Alert and oriented. Well appearing and in no distress. Head: Normocephalic and atraumatic.      Eyes: Conjunctivae are normal. PERRL. Normal extraocular movements      Ears: Canals clear. TMs intact bilaterally.   Nose: No congestion/rhinorrhea.   Mouth/Throat: Mucous membranes are moist. Uvula is midline and tonsils are flat. Left lower jaw notes a wisdom tooth partially covered by buccal surface gum. The right wisdom tooth is partially erupted.    Neck: Supple. No thyromegaly. Hematological/Lymphatic/Immunological: No cervical lymphadenopathy. Respiratory: Normal respiratory effort.  Musculoskeletal:  Nontender with normal range of motion in all extremities.  Neurologic:  Normal gait without ataxia. Normal speech and language. No gross focal neurologic deficits are appreciated. Skin:  Skin is warm, dry and intact. No rash noted. -------------------------------------------------------------------------  DENTAL BLOCK  Performed by: Lissa Hoard Consent: Verbal consent obtained. Required items: devices and special equipment available Time out: Immediately prior to procedure a "time out" was called to verify the correct patient, procedure, equipment, support staff and site/side marked as required.  Indication: pain Nerve block body site: right 3rd molar  Preparation: Patient was prepped and draped in the usual sterile fashion. Needle gauge: 27 G Location technique: anatomical landmarks  Local anesthetic: 2% lidocaine/1:100,000 epinephrine  Anesthetic total: 1 ml  Outcome: pain improved Patient tolerance: Patient tolerated the procedure well with no immediate complications. Purulent drainage noted upon instillation of the anesthetic. ____________________________________________  INITIAL IMPRESSION / ASSESSMENT AND PLAN / ED COURSE  Patient with an dental pains to the right lower molar secondary to partially impacted tooth and underlying dental abscess. She'll be discharged with prescriptions for penicillin VK, tramadol, ibuprofen 800, and viscous lidocaine to gargle. She will follow up with [facility] dental clinics or with one of the local dental providers for definitive management. ____________________________________________  FINAL CLINICAL IMPRESSION(S) / ED DIAGNOSES  Final diagnoses:  Pain, dental  Impacted tooth  Dental abscess      Lissa Hoard, PA-C 08/05/15 1552  Minna Antis, MD 08/08/15 2242

## 2015-11-11 ENCOUNTER — Emergency Department: Payer: 59

## 2015-11-11 ENCOUNTER — Encounter: Payer: Self-pay | Admitting: Emergency Medicine

## 2015-11-11 ENCOUNTER — Emergency Department
Admission: EM | Admit: 2015-11-11 | Discharge: 2015-11-11 | Disposition: A | Discharge status: Home / Self Care | Payer: 59 | Attending: Emergency Medicine | Admitting: Emergency Medicine

## 2015-11-11 DIAGNOSIS — O99331 Smoking (tobacco) complicating pregnancy, first trimester: Secondary | ICD-10-CM | POA: Diagnosis not present

## 2015-11-11 DIAGNOSIS — O208 Other hemorrhage in early pregnancy: Secondary | ICD-10-CM | POA: Diagnosis not present

## 2015-11-11 DIAGNOSIS — O200 Threatened abortion: Secondary | ICD-10-CM

## 2015-11-11 DIAGNOSIS — O209 Hemorrhage in early pregnancy, unspecified: Secondary | ICD-10-CM | POA: Diagnosis not present

## 2015-11-11 DIAGNOSIS — F17210 Nicotine dependence, cigarettes, uncomplicated: Secondary | ICD-10-CM | POA: Insufficient documentation

## 2015-11-11 DIAGNOSIS — Z3A11 11 weeks gestation of pregnancy: Secondary | ICD-10-CM | POA: Insufficient documentation

## 2015-11-11 DIAGNOSIS — N939 Abnormal uterine and vaginal bleeding, unspecified: Secondary | ICD-10-CM

## 2015-11-11 LAB — WET PREP, GENITAL
Clue Cells Wet Prep HPF POC: NONE SEEN
Sperm: NONE SEEN
Trich, Wet Prep: NONE SEEN
Yeast Wet Prep HPF POC: NONE SEEN

## 2015-11-11 LAB — CBC WITH DIFFERENTIAL/PLATELET
BASOS PCT: 1 %
Basophils Absolute: 0.1 10*3/uL (ref 0–0.1)
Eosinophils Absolute: 0.1 10*3/uL (ref 0–0.7)
Eosinophils Relative: 1 %
HCT: 32.8 % — ABNORMAL LOW (ref 35.0–47.0)
Hemoglobin: 11 g/dL — ABNORMAL LOW (ref 12.0–16.0)
Lymphocytes Relative: 17 %
Lymphs Abs: 1.4 10*3/uL (ref 1.0–3.6)
MCH: 22.7 pg — AB (ref 26.0–34.0)
MCHC: 33.7 g/dL (ref 32.0–36.0)
MCV: 67.6 fL — AB (ref 80.0–100.0)
MONOS PCT: 10 %
Monocytes Absolute: 0.8 10*3/uL (ref 0.2–0.9)
Neutro Abs: 5.8 10*3/uL (ref 1.4–6.5)
Neutrophils Relative %: 71 %
Platelets: 254 10*3/uL (ref 150–440)
RBC: 4.86 MIL/uL (ref 3.80–5.20)
RDW: 15.6 % — AB (ref 11.5–14.5)
WBC: 8.1 10*3/uL (ref 3.6–11.0)

## 2015-11-11 LAB — BASIC METABOLIC PANEL
Anion gap: 8 (ref 5–15)
BUN: 10 mg/dL (ref 6–20)
CALCIUM: 8.7 mg/dL — AB (ref 8.9–10.3)
CO2: 22 mmol/L (ref 22–32)
CREATININE: 0.51 mg/dL (ref 0.44–1.00)
Chloride: 107 mmol/L (ref 101–111)
GFR calc non Af Amer: >60 mL/min (ref >60)
GLUCOSE: 87 mg/dL (ref 65–99)
Potassium: 3.5 mmol/L (ref 3.5–5.1)
Sodium: 137 mmol/L (ref 135–145)

## 2015-11-11 LAB — CHLAMYDIA/NGC RT PCR (ARMC ONLY)
CHLAMYDIA TR: NOT DETECTED
N gonorrhoeae: NOT DETECTED

## 2015-11-11 LAB — URINALYSIS COMPLETE WITH MICROSCOPIC (ARMC ONLY)
BACTERIA UA: NONE SEEN
Bilirubin Urine: NEGATIVE
Glucose, UA: NEGATIVE mg/dL
Ketones, ur: NEGATIVE mg/dL
Leukocytes, UA: NEGATIVE
Nitrite: NEGATIVE
PH: 7 (ref 5.0–8.0)
PROTEIN: NEGATIVE mg/dL
Specific Gravity, Urine: 1.012 (ref 1.005–1.030)

## 2015-11-11 LAB — HCG, QUANTITATIVE, PREGNANCY: hCG, Beta Chain, Quant, S: 123716 m[IU]/mL — ABNORMAL HIGH (ref <5)

## 2015-11-11 LAB — POCT PREGNANCY, URINE: Preg Test, Ur: POSITIVE — AB

## 2015-11-11 NOTE — Discharge Instructions (Signed)
Please seek medical attention for any high fevers, chest pain, shortness of breath, change in behavior, persistent vomiting, bloody stool or any other new or concerning symptoms.  

## 2015-11-11 NOTE — ED Notes (Signed)
Patient transported to Ultrasound 

## 2015-11-11 NOTE — ED Provider Notes (Signed)
[facility] Emergency Department Provider Note    ____________________________________________   I have reviewed the triage vital signs and the nursing notes.   HISTORY  Chief Complaint Vaginal Bleeding   History limited by: Not Limited   HPI Krista Rich is a 25 y.o. female who comes into the emergency department today because of concerns for vaginal bleeding in early pregnancy. Patient last had her period in May. She started noticing some spotting 2 days ago. This morning however she noticed more bright red blood. It was on the bed and when she woke up. The patient denies any significant pelvic pain with this. Denies any other vaginal discharge. Denies any fevers. Has not had any problems with vaginal bleeding in the past pregnancies. No fevers. She has had some nausea with this pregnancy but no vomiting.    History reviewed. No pertinent past medical history.  Patient Active Problem List   Diagnosis Date Noted  . Preterm PROM, delivered, current hospitalization 10/08/2014  . Late prenatal care affecting pregnancy in second trimester, antepartum 10/08/2014  . Labor and delivery, indication for care 08/24/2014    History reviewed. No pertinent surgical history.  Prior to Admission medications   Medication Sig Start Date End Date Taking? Authorizing Provider  ibuprofen (ADVIL,MOTRIN) 800 MG tablet Take 1 tablet (800 mg total) by mouth every 8 (eight) hours as needed. 08/05/15   Jenise V Bacon Menshew, PA-C  lidocaine (XYLOCAINE) 2 % solution Use as directed 10 mLs in the mouth or throat every 6 (six) hours as needed for mouth pain. 08/05/15   Jenise V Bacon Menshew, PA-C  medroxyPROGESTERone (DEPO-PROVERA) 150 MG/ML injection Inject 1 mL (150 mg total) into the muscle every 3 (three) months. 10/05/14   [name], MD  penicillin v potassium (VEETID) 500 MG tablet Take 1 tablet (500 mg total) by mouth 4 (four) times daily. 08/05/15   Jenise V Bacon  Menshew, PA-C  traMADol (ULTRAM) 50 MG tablet Take 1 tablet (50 mg total) by mouth 2 (two) times daily. 08/05/15   Jenise V Bacon Menshew, PA-C    Allergies Review of patient's allergies indicates no known allergies.  Family History  Problem Relation Age of Onset  . Hyperlipidemia Mother     Social History Social History  Substance Use Topics  . Smoking status: Current Every Day Smoker    Types: Cigarettes  . Smokeless tobacco: Never Used  . Alcohol use No    Review of Systems  Constitutional: Negative for fever. Cardiovascular: Negative for chest pain. Respiratory: Negative for shortness of breath. Gastrointestinal: Negative for abdominal pain, vomiting and diarrhea. Neurological: Negative for headaches, focal weakness or numbness.  10-point ROS otherwise negative.  ____________________________________________   PHYSICAL EXAM:  VITAL SIGNS: ED Triage Vitals  Enc Vitals Group     BP 11/11/15 0814 125/70     Pulse Rate 11/11/15 0814 88     Resp 11/11/15 0814 16     Temp 11/11/15 0814 98.5 F (36.9 C)     Temp Source 11/11/15 0814 Oral     SpO2 11/11/15 0814 100 %     Weight 11/11/15 0814 120 lb (54.4 kg)     Height 11/11/15 0814  (1.651 m)     Head Circumference --      Peak Flow --      Pain Score 11/11/15 0813 0   Constitutional: Alert and oriented. Well appearing and in no distress. Eyes: Conjunctivae are normal. PERRL. Normal extraocular movements. ENT  Head: Normocephalic and atraumatic.   Nose: No congestion/rhinnorhea.   Mouth/Throat: Mucous membranes are moist.   Neck: No stridor. Hematological/Lymphatic/Immunilogical: No cervical lymphadenopathy. Cardiovascular: Normal rate, regular rhythm.  No murmurs, rubs, or gallops. Respiratory: Normal respiratory effort without tachypnea nor retractions. Breath sounds are clear and equal bilaterally. No wheezes/rales/rhonchi. Gastrointestinal: Soft and nontender. No distention. There is no  CVA tenderness. Genitourinary: No external lesions or rashes. Small amount of blood in vaginal vault. No active bleeding noted. No CMT. No adnexal fullness or tenderness.  Musculoskeletal: Normal range of motion in all extremities. No joint effusions.  No lower extremity tenderness nor edema. Neurologic:  Normal speech and language. No gross focal neurologic deficits are appreciated.  Skin:  Skin is warm, dry and intact. No rash noted. Psychiatric: Mood and affect are normal. Speech and behavior are normal. Patient exhibits appropriate insight and judgment.  ____________________________________________    LABS (pertinent positives/negatives)  Labs Reviewed  WET PREP, GENITAL - Abnormal; Notable for the following:       Result Value   WBC, Wet Prep HPF POC FEW (*)    All other components within normal limits  CBC WITH DIFFERENTIAL/PLATELET - Abnormal; Notable for the following:    Hemoglobin 11.0 (*)    HCT 32.8 (*)    MCV 67.6 (*)    MCH 22.7 (*)    RDW 15.6 (*)    All other components within normal limits  BASIC METABOLIC PANEL - Abnormal; Notable for the following:    Calcium 8.7 (*)    All other components within normal limits  HCG, QUANTITATIVE, PREGNANCY - Abnormal; Notable for the following:    hCG, Beta Chain, Quant, S 123,716 (*)    All other components within normal limits  URINALYSIS COMPLETEWITH MICROSCOPIC ([facility] ONLY) - Abnormal; Notable for the following:    Color, Urine YELLOW (*)    APPearance CLOUDY (*)    Hgb urine dipstick 1+ (*)    Squamous Epithelial / LPF 0-5 (*)    All other components within normal limits  POCT PREGNANCY, URINE - Abnormal; Notable for the following:    Preg Test, Ur POSITIVE (*)    All other components within normal limits  CHLAMYDIA/NGC RT PCR ([facility] ONLY)     ____________________________________________   EKG  None  ____________________________________________    RADIOLOGY  US IMPRESSION: 1. Single live  IUP. 2. Small subchorionic hemorrhage. 3. The placenta overlies the 3.2 cm closed cervix. Recommend attention to the placenta location versus the cervix on the study for anatomic imaging.  ____________________________________________   PROCEDURES  Procedures  ____________________________________________   INITIAL IMPRESSION / ASSESSMENT AND PLAN / ED COURSE  Pertinent labs & imaging results that were available during my care of the patient were reviewed by me and considered in my medical decision making (see chart for details).  Patient presented to the emergency department today with vaginal bleeding in early pregnancy. Patient O Pos per previous blood work. Will perform pelvic exam, obtain US.  Clinical Course   US shows live IUP at 11 weeks. UA, wet prep without concerning findings. Will discharge home to follow up with ob/gyn ____________________________________________   FINAL CLINICAL IMPRESSION(S) / ED DIAGNOSES  Final diagnoses:  Vaginal bleeding  Threatened miscarriage     Note: This dictation was prepared with Dragon dictation. Any transcriptional errors that result from this process are unintentional    Phineas SemenGraydon Christinia Lambeth, MD 11/11/15 1015

## 2015-11-11 NOTE — ED Triage Notes (Signed)
G3 P2.  [redacted] weeks pregnant.  Reports spotting since Friday and woke up this morning with bright red vaginal bleeding.  Denies c/o Pain.  Has not had any prenatal care.

## 2015-11-14 MED ORDER — ONDANSETRON HCL 4 MG/2ML IJ SOLN
INTRAMUSCULAR | Status: AC
  Filled 2015-11-14: qty 2

## 2015-11-20 ENCOUNTER — Ambulatory Visit (INDEPENDENT_AMBULATORY_CARE_PROVIDER_SITE_OTHER): Payer: 59 | Admitting: Obstetrics and Gynecology

## 2015-11-20 ENCOUNTER — Encounter: Payer: Self-pay | Admitting: Obstetrics and Gynecology

## 2015-11-20 VITALS — BP 104/63 | HR 86 | Ht 65.0 in | Wt 116.4 lb

## 2015-11-20 DIAGNOSIS — O418X10 Other specified disorders of amniotic fluid and membranes, first trimester, not applicable or unspecified: Secondary | ICD-10-CM | POA: Insufficient documentation

## 2015-11-20 DIAGNOSIS — Z3491 Encounter for supervision of normal pregnancy, unspecified, first trimester: Secondary | ICD-10-CM

## 2015-11-20 DIAGNOSIS — O43891 Other placental disorders, first trimester: Secondary | ICD-10-CM | POA: Diagnosis not present

## 2015-11-20 DIAGNOSIS — O468X1 Other antepartum hemorrhage, first trimester: Secondary | ICD-10-CM

## 2015-11-20 DIAGNOSIS — O209 Hemorrhage in early pregnancy, unspecified: Secondary | ICD-10-CM

## 2015-11-20 DIAGNOSIS — O4691 Antepartum hemorrhage, unspecified, first trimester: Secondary | ICD-10-CM

## 2015-11-20 DIAGNOSIS — Z720 Tobacco use: Secondary | ICD-10-CM | POA: Diagnosis not present

## 2015-11-20 DIAGNOSIS — Z1379 Encounter for other screening for genetic and chromosomal anomalies: Secondary | ICD-10-CM

## 2015-11-20 DIAGNOSIS — Z315 Encounter for genetic counseling: Secondary | ICD-10-CM

## 2015-11-20 DIAGNOSIS — Z3490 Encounter for supervision of normal pregnancy, unspecified, unspecified trimester: Secondary | ICD-10-CM

## 2015-11-20 MED ORDER — CITRANATAL HARMONY 27-1-260 MG PO CAPS: 27.0000 mg | ORAL_CAPSULE | Freq: Every day | ORAL | 11 refills | Status: DC

## 2015-11-20 NOTE — Progress Notes (Signed)
   GYNECOLOGY CLINIC VISIT Subjective:    Krista Rich is a 25 y.o. [phone] [id number] female who presents for ER follow up. Herbert SetaHeather reports vaginal bleeding in early pregnancy, was seen in the ER on 11/11/2015.  Patient's last menstrual period was 08/30/2015.  Notes that bleeding stopped 2 days after her visit. Denied any pelvic pain or vaginal discharge at that time.  States that she was diagnosed with pregnancy in the ER, however suspected that she was pregnant as she had not had a menstrual cycle in almost 2 months.  Notes that she recently got married.  She is not in acute distress.  Currently denies any symptoms.  Ectopic risks: none.  Cycle length: regular, ~ q 30 days. Pregnancy testing: quant HCG level 123,716 on 11/11/2015. Pregnancy imaging: transvaginal ultrasound done on 11/11/2015. Result SIUP at 11.4 weeks, with FHT 175 bpm.  Small subchorionic hemorrhage. Blood type: O positive. Other lab results: hematocrit 32.8 and urinalysis with +1 Hgb, otherwise normal.     History reviewed. No pertinent past medical history.   Family History  Problem Relation Age of Onset  . Hyperlipidemia Mother     History reviewed. No pertinent surgical history.  Social History  Substance Use Topics  . Smoking status: Current Some Day Smoker    Packs/day: 0.25    Types: Cigarettes  . Smokeless tobacco: Never Used  . Alcohol use No    No current outpatient prescriptions on file prior to visit.   No current facility-administered medications on file prior to visit.     No Known Allergies   Review of Systems Pertinent items are noted in HPI. Pertinent items noted in HPI and remainder of comprehensive ROS otherwise negative.   Objective:     BP 104/63 (BP Location: Right Arm, Patient Position: Sitting, Cuff Size: Normal)   Pulse 86   Ht 5\' 5"  (1.651 m)   Wt 116 lb 6.4 oz (52.8 kg)   LMP 08/30/2015   BMI 19.37 kg/m  General:   alert and no distress  Heart: regular rate and rhythm, S1, S2  normal, no murmur, click, rub or gallop  Lungs: clear to auscultation bilaterally  Abdomen: soft, non-tender, without masses or organomegaly.  FHT 154 bpm.   Pelvic: Exam deferred.          Extremities: Normal, no cyanosis, tenderness, or edema.           Neurologic:  Grossly normal    Assessment:    Bleeding in early pregnancy, likely secondary to small subchorionic hemorrhage  IUP at 12.[redacted] weeks gestation Tobacco abuse   Plan:   - Follow-up appointment in 1-3 days for 1st trimester screen.  1 week for NOB interview.  To f/u in 3 weeks for NOB visit.  - Discussed bleeding precautions.  - Discussed smoking cessation.    Hildred LaserAnika Sharlon Pfohl, MD [facility]

## 2015-11-26 ENCOUNTER — Ambulatory Visit (INDEPENDENT_AMBULATORY_CARE_PROVIDER_SITE_OTHER): Payer: 59 | Admitting: Obstetrics and Gynecology

## 2015-11-26 VITALS — BP 108/60 | HR 91 | Wt 117.9 lb

## 2015-11-26 DIAGNOSIS — Z369 Encounter for antenatal screening, unspecified: Secondary | ICD-10-CM

## 2015-11-26 DIAGNOSIS — Z3492 Encounter for supervision of normal pregnancy, unspecified, second trimester: Secondary | ICD-10-CM

## 2015-11-26 DIAGNOSIS — Z36 Encounter for antenatal screening of mother: Secondary | ICD-10-CM | POA: Diagnosis not present

## 2015-11-26 DIAGNOSIS — Z3490 Encounter for supervision of normal pregnancy, unspecified, unspecified trimester: Secondary | ICD-10-CM

## 2015-11-26 DIAGNOSIS — Z331 Pregnant state, incidental: Secondary | ICD-10-CM | POA: Diagnosis not present

## 2015-11-26 DIAGNOSIS — Z113 Encounter for screening for infections with a predominantly sexual mode of transmission: Secondary | ICD-10-CM | POA: Diagnosis not present

## 2015-11-26 DIAGNOSIS — Z1389 Encounter for screening for other disorder: Secondary | ICD-10-CM

## 2015-11-26 NOTE — Patient Instructions (Signed)
Pregnancy and Zika Virus Disease Zika virus disease, or Zika, is an illness that can spread to people from mosquitoes that carry the virus. It may also spread from person to person through infected body fluids. Zika first occurred in [place], but recently it has spread to new areas. The virus occurs in tropical climates. The location of Zika continues to change. Most people who become infected with Zika virus do not develop serious illness. However, Zika may cause birth defects in an unborn baby whose mother is infected with the virus. It may also increase the risk of miscarriage. WHAT ARE THE SYMPTOMS OF ZIKA VIRUS DISEASE? In many cases, people who have been infected with Zika virus do not develop any symptoms. If symptoms appear, they usually start about a week after the person is infected. Symptoms are usually mild. They may include:  Fever.  Rash.  Red eyes.  Joint pain. HOW DOES ZIKA VIRUS DISEASE SPREAD? The main way that Zika virus spreads is through the bite of a certain type of mosquito. Unlike most types of mosquitos, which bite only at night, the type of mosquito that carries Zika virus bites both at night and during the day. Zika virus can also spread through sexual contact, through a blood transfusion, and from a mother to her baby before or during birth. Once you have had Zika virus disease, it is unlikely that you will get it again. CAN I PASS ZIKA TO MY BABY DURING PREGNANCY? Yes, Zika can pass from a mother to her baby before or during birth. WHAT PROBLEMS CAN ZIKA CAUSE FOR MY BABY? A woman who is infected with Zika virus while pregnant is at risk of having her baby born with a condition in which the brain or head is smaller than expected (microcephaly). Babies who have microcephaly can have developmental delays, seizures, hearing problems, and vision problems. Having Zika virus disease during pregnancy can also increase the risk of miscarriage. HOW CAN ZIKA VIRUS DISEASE BE  PREVENTED? There is no vaccine to prevent Zika. The best way to prevent the disease is to avoid infected mosquitoes and avoid exposure to body fluids that can spread the virus. Avoid any possible exposure to Zika by taking the following precautions. For women and their sex partners:  Avoid traveling to high-risk areas. The locations where Zika is being reported change often. To identify high-risk areas, check the CDC travel website: www.cdc.gov/zika/geo/index.html  If you or your sex partner must travel to a high-risk area, talk with a health care provider before and after traveling.  Take all precautions to avoid mosquito bites if you live in, or travel to, any of the high-risk areas. Insect repellents are safe to use during pregnancy.  Ask your health care provider when it is safe to have sexual contact. For women:  If you are pregnant or trying to become pregnant, avoid sexual contact with persons who may have been exposed to Zika virus, persons who have possible symptoms of Zika, or persons whose history you are unsure about. If you choose to have sexual contact with someone who may have been exposed to Zika virus, use condoms correctly during the entire duration of sexual activity, every time. Do not share sexual devices, as you may be exposed to body fluids.  Ask your health care provider about when it is safe to attempt pregnancy after a possible exposure to Zika virus. WHAT STEPS SHOULD I TAKE TO AVOID MOSQUITO BITES? Take these steps to avoid mosquito bites when you are   in a high-risk area:  Wear loose clothing that covers your arms and legs.  Limit your outdoor activities.  Do not open windows unless they have window screens.  Sleep under mosquito nets.  Use insect repellent. The best insect repellents have:  DEET, picaridin, oil of lemon eucalyptus (OLE), or IR3535 in them.  Higher amounts of an active ingredient in them.  Remember that insect repellents are safe to use  during pregnancy.  Do not use OLE on children who are younger than 3 years of age. Do not use insect repellent on babies who are younger than 2 months of age.  Cover your child's stroller with mosquito netting. Make sure the netting fits snugly and that any loose netting does not cover your child's mouth or nose. Do not use a blanket as a mosquito-protection cover.  Do not apply insect repellent underneath clothing.  If you are using sunscreen, apply the sunscreen before applying the insect repellent.  Treat clothing with permethrin. Do not apply permethrin directly to your skin. Follow label directions for safe use.  Get rid of standing water, where mosquitoes may reproduce. Standing water is often found in items such as buckets, bowls, animal food dishes, and flowerpots. When you return from traveling to any high-risk area, continue taking actions to protect yourself against mosquito bites for 3 weeks, even if you show no signs of illness. This will prevent spreading Zika virus to uninfected mosquitoes. WHAT SHOULD I KNOW ABOUT THE SEXUAL TRANSMISSION OF ZIKA? People can spread Zika to their sexual partners during vaginal, anal, or oral sex, or by sharing sexual devices. Many people with Zika do not develop symptoms, so a person could spread the disease without knowing that they are infected. The greatest risk is to women who are pregnant or who may become pregnant. Zika virus can live longer in semen than it can live in blood. Couples can prevent sexual transmission of the virus by:  Using condoms correctly during the entire duration of sexual activity, every time. This includes vaginal, anal, and oral sex.  Not sharing sexual devices. Sharing increases your risk of being exposed to body fluid from another person.  Avoiding all sexual activity until your health care provider says it is safe. SHOULD I BE TESTED FOR ZIKA VIRUS? A sample of your blood can be tested for Zika virus. A pregnant  woman should be tested if she may have been exposed to the virus or if she has symptoms of Zika. She may also have additional tests done during her pregnancy, such ultrasound testing. Talk with your health care provider about which tests are recommended.   This information is not intended to replace advice given to you by your health care provider. Make sure you discuss any questions you have with your health care provider.   Document Released: 12/13/2014 Document Reviewed: 12/06/2014 Elsevier Interactive Patient Education 2016 [employer] Minor Illnesses and Medications in Pregnancy  Cold/Flu:  Sudafed for congestion- Robitussin (plain) for cough- Tylenol for discomfort.  Please follow the directions on the label.  Try not to take any more than needed.  OTC Saline nasal spray and air humidifier or cool-mist  Vaporizer to sooth nasal irritation and to loosen congestion.  It is also important to increase intake of non carbonated fluids, especially if you have a fever.  Constipation:  Colace-2 capsules at bedtime; Metamucil- follow directions on label; Senokot- 1 tablet at bedtime.  Any one of these medications can be used.  It is also   very important to increase fluids and fruits along with regular exercise.  If problem persists please call the office.  Diarrhea:  Kaopectate as directed on the label.  Eat a bland diet and increase fluids.  Avoid highly seasoned foods.  Headache:  Tylenol 1 or 2 tablets every 3-4 hours as needed  Indigestion:  Maalox, Mylanta, Tums or Rolaids- as directed on label.  Also try to eat small meals and avoid fatty, greasy or spicy foods.  Nausea with or without Vomiting:  Nausea in pregnancy is caused by increased levels of hormones in the body which influence the digestive system and cause irritation when stomach acids accumulate.  Symptoms usually subside after 1st trimester of pregnancy.  Try the following: 1. Keep saltines, graham crackers or dry toast by your bed  to eat upon awakening. 2. Don't let your stomach get empty.  Try to eat 5-6 small meals per day instead of 3 large ones. 3. Avoid greasy fatty or highly seasoned foods.  4. Take OTC Unisom 1 tablet at bed time along with OTC Vitamin B6 25-50 mg 3 times per day.    If nausea continues with vomiting and you are unable to keep down food and fluids you may need a prescription medication.  Please notify your provider.   Sore throat:  Chloraseptic spray, throat lozenges and or plain Tylenol.  Vaginal Yeast Infection:  OTC Monistat for 7 days as directed on label.  If symptoms do not resolve within a week notify provider.  If any of the above problems do not subside with recommended treatment please call the office for further assistance.   Do not take Aspirin, Advil, Motrin or Ibuprofen.  * * OTC= Over the counter Hyperemesis Gravidarum Hyperemesis gravidarum is a severe form of nausea and vomiting that happens during pregnancy. Hyperemesis is worse than morning sickness. It may cause you to have nausea or vomiting all day for many days. It may keep you from eating and drinking enough food and liquids. Hyperemesis usually occurs during the first half (the first 20 weeks) of pregnancy. It often goes away once a woman is in her second half of pregnancy. However, sometimes hyperemesis continues through an entire pregnancy.  CAUSES  The cause of this condition is not completely known but is thought to be related to changes in the body's hormones when pregnant. It could be from the high level of the pregnancy hormone or an increase in estrogen in the body.  SIGNS AND SYMPTOMS   Severe nausea and vomiting.  Nausea that does not go away.  Vomiting that does not allow you to keep any food down.  Weight loss and body fluid loss (dehydration).  Having no desire to eat or not liking food you have previously enjoyed. DIAGNOSIS  Your health care provider will do a physical exam and ask you about your  symptoms. He or she may also order blood tests and urine tests to make sure something else is not causing the problem.  TREATMENT  You may only need medicine to control the problem. If medicines do not control the nausea and vomiting, you will be treated in the hospital to prevent dehydration, increased acid in the blood (acidosis), weight loss, and changes in the electrolytes in your body that may harm the unborn baby (fetus). You may need IV fluids.  HOME CARE INSTRUCTIONS   Only take over-the-counter or prescription medicines as directed by your health care provider.  Try eating a couple of dry crackers or   toast in the morning before getting out of bed.  Avoid foods and smells that upset your stomach.  Avoid fatty and spicy foods.  Eat 5-6 small meals a day.  Do not drink when eating meals. Drink between meals.  For snacks, eat high-protein foods, such as cheese.  Eat or suck on things that have ginger in them. Ginger helps nausea.  Avoid food preparation. The smell of food can spoil your appetite.  Avoid iron pills and iron in your multivitamins until after 3-4 months of being pregnant. However, consult with your health care provider before stopping any prescribed iron pills. SEEK MEDICAL CARE IF:   Your abdominal pain increases.  You have a severe headache.  You have vision problems.  You are losing weight. SEEK IMMEDIATE MEDICAL CARE IF:   You are unable to keep fluids down.  You vomit blood.  You have constant nausea and vomiting.  You have excessive weakness.  You have extreme thirst.  You have dizziness or fainting.  You have a fever or persistent symptoms for more than 2-3 days.  You have a fever and your symptoms suddenly get worse. MAKE SURE YOU:   Understand these instructions.  Will watch your condition.  Will get help right away if you are not doing well or get worse.   This information is not intended to replace advice given to you by your  health care provider. Make sure you discuss any questions you have with your health care provider.   Document Released: 03/24/2005 Document Revised: 01/12/2013 Document Reviewed: 11/03/2012 Elsevier Interactive Patient Education 2016 [employer]vier [employer]. Commonly Asked Questions During Pregnancy  Cats: A parasite can be excreted in cat feces.  To avoid exposure you need to have another person empty the little box.  If you must empty the litter box you will need to wear gloves.  Wash your hands after handling your cat.  This parasite can also be found in raw or undercooked meat so this should also be avoided.  Colds, Sore Throats, Flu: Please check your medication sheet to see what you can take for symptoms.  If your symptoms are unrelieved by these medications please call the office.  Dental Work: Most any dental work your dentist recommends is permitted.  X-rays should only be taken during the first trimester if absolutely necessary.  Your abdomen should be shielded with a lead apron during all x-rays.  Please notify your provider prior to receiving any x-rays.  Novocaine is fine; gas is not recommended.  If your dentist requires a note from us prior to dental work please call the office and we will provide one for you.  Exercise: Exercise is an important part of staying healthy during your pregnancy.  You may continue most exercises you were accustomed to prior to pregnancy.  Later in your pregnancy you will most likely notice you have difficulty with activities requiring balance like riding a bicycle.  It is important that you listen to your body and avoid activities that put you at a higher risk of falling.  Adequate rest and staying well hydrated are a must!  If you have questions about the safety of specific activities ask your provider.    Exposure to Children with illness: Try to avoid obvious exposure; report any symptoms to us when noted,  If you have chicken pos, red measles or mumps, you should  be immune to these diseases.   Please do not take any vaccines while pregnant unless you have checked with   your OB provider.  Fetal Movement: After 28 weeks we recommend you do "kick counts" twice daily.  Lie or sit down in a calm quiet environment and count your baby movements "kicks".  You should feel your baby at least 10 times per hour.  If you have not felt 10 kicks within the first hour get up, walk around and have something sweet to eat or drink then repeat for an additional hour.  If count remains less than 10 per hour notify your provider.  Fumigating: Follow your pest control agent's advice as to how long to stay out of your home.  Ventilate the area well before re-entering.  Hemorrhoids:   Most over-the-counter preparations can be used during pregnancy.  Check your medication to see what is safe to use.  It is important to use a stool softener or fiber in your diet and to drink lots of liquids.  If hemorrhoids seem to be getting worse please call the office.   Hot Tubs:  Hot tubs Jacuzzis and saunas are not recommended while pregnant.  These increase your internal body temperature and should be avoided.  Intercourse:  Sexual intercourse is safe during pregnancy as long as you are comfortable, unless otherwise advised by your provider.  Spotting may occur after intercourse; report any bright red bleeding that is heavier than spotting.  Labor:  If you know that you are in labor, please go to the hospital.  If you are unsure, please call the office and let us help you decide what to do.  Lifting, straining, etc:  If your job requires heavy lifting or straining please check with your provider for any limitations.  Generally, you should not lift items heavier than that you can lift simply with your hands and arms (no back muscles)  Painting:  Paint fumes do not harm your pregnancy, but may make you ill and should be avoided if possible.  Latex or water based paints have less odor than oils.   Use adequate ventilation while painting.  Permanents & Hair Color:  Chemicals in hair dyes are not recommended as they cause increase hair dryness which can increase hair loss during pregnancy.  " Highlighting" and permanents are allowed.  Dye may be absorbed differently and permanents may not hold as well during pregnancy.  Sunbathing:  Use a sunscreen, as skin burns easily during pregnancy.  Drink plenty of fluids; avoid over heating.  Tanning Beds:  Because their possible side effects are still unknown, tanning beds are not recommended.  Ultrasound Scans:  Routine ultrasounds are performed at approximately 20 weeks.  You will be able to see your baby's general anatomy an if you would like to know the gender this can usually be determined as well.  If it is questionable when you conceived you may also receive an ultrasound early in your pregnancy for dating purposes.  Otherwise ultrasound exams are not routinely performed unless there is a medical necessity.  Although you can request a scan we ask that you pay for it when conducted because insurance does not cover " patient request" scans.  Work: If your pregnancy proceeds without complications you may work until your due date, unless your physician or employer advises otherwise.  Round Ligament Pain/Pelvic Discomfort:  Sharp, shooting pains not associated with bleeding are fairly common, usually occurring in the second trimester of pregnancy.  They tend to be worse when standing up or when you remain standing for long periods of time.  These are the result   of pressure of certain pelvic ligaments called "round ligaments".  Rest, Tylenol and heat seem to be the most effective relief.  As the womb and fetus grow, they rise out of the pelvis and the discomfort improves.  Please notify the office if your pain seems different than that described.  It may represent a more serious condition.   

## 2015-11-26 NOTE — Progress Notes (Signed)
Krista Rich presents for NOB nurse interview visit. G-3.  P-2002. Pregnancy education material explained and given.  No cats in the home. NOB labs ordered.  HIV labs and Drug screen were explained optional and she could opt out of tests but did not decline. Drug screen ordered. PNV encouraged. NT ordered for tomorrow and pt will be 13.6wks. Will do second trimester if measurement of fetus are not within range for NT.  Pt. To follow up with provider in 1 weeks for NOB physical.  All questions answered.

## 2015-11-27 ENCOUNTER — Other Ambulatory Visit: Payer: Self-pay | Admitting: Obstetrics and Gynecology

## 2015-11-27 ENCOUNTER — Ambulatory Visit (INDEPENDENT_AMBULATORY_CARE_PROVIDER_SITE_OTHER): Payer: 59 | Admitting: Obstetrics and Gynecology

## 2015-11-27 ENCOUNTER — Encounter: Payer: Self-pay | Admitting: Obstetrics and Gynecology

## 2015-11-27 ENCOUNTER — Ambulatory Visit (INDEPENDENT_AMBULATORY_CARE_PROVIDER_SITE_OTHER): Payer: 59

## 2015-11-27 ENCOUNTER — Other Ambulatory Visit: Payer: 59

## 2015-11-27 VITALS — BP 100/69 | HR 96 | Wt 116.0 lb

## 2015-11-27 DIAGNOSIS — Z36 Encounter for antenatal screening of mother: Secondary | ICD-10-CM | POA: Diagnosis not present

## 2015-11-27 DIAGNOSIS — O43891 Other placental disorders, first trimester: Secondary | ICD-10-CM

## 2015-11-27 DIAGNOSIS — Z3482 Encounter for supervision of other normal pregnancy, second trimester: Secondary | ICD-10-CM

## 2015-11-27 DIAGNOSIS — Z124 Encounter for screening for malignant neoplasm of cervix: Secondary | ICD-10-CM

## 2015-11-27 DIAGNOSIS — Z3490 Encounter for supervision of normal pregnancy, unspecified, unspecified trimester: Secondary | ICD-10-CM

## 2015-11-27 DIAGNOSIS — O09899 Supervision of other high risk pregnancies, unspecified trimester: Secondary | ICD-10-CM | POA: Insufficient documentation

## 2015-11-27 DIAGNOSIS — O09212 Supervision of pregnancy with history of pre-term labor, second trimester: Secondary | ICD-10-CM

## 2015-11-27 DIAGNOSIS — Z315 Encounter for genetic counseling: Secondary | ICD-10-CM | POA: Diagnosis not present

## 2015-11-27 DIAGNOSIS — Z331 Pregnant state, incidental: Secondary | ICD-10-CM | POA: Diagnosis not present

## 2015-11-27 DIAGNOSIS — Z1379 Encounter for other screening for genetic and chromosomal anomalies: Secondary | ICD-10-CM

## 2015-11-27 DIAGNOSIS — O09892 Supervision of other high risk pregnancies, second trimester: Secondary | ICD-10-CM

## 2015-11-27 DIAGNOSIS — Z720 Tobacco use: Secondary | ICD-10-CM

## 2015-11-27 DIAGNOSIS — O09219 Supervision of pregnancy with history of pre-term labor, unspecified trimester: Secondary | ICD-10-CM

## 2015-11-27 DIAGNOSIS — O468X1 Other antepartum hemorrhage, first trimester: Secondary | ICD-10-CM

## 2015-11-27 DIAGNOSIS — Z3492 Encounter for supervision of normal pregnancy, unspecified, second trimester: Secondary | ICD-10-CM

## 2015-11-27 DIAGNOSIS — O418X10 Other specified disorders of amniotic fluid and membranes, first trimester, not applicable or unspecified: Secondary | ICD-10-CM

## 2015-11-27 LAB — CBC WITH DIFFERENTIAL/PLATELET
BASOS ABS: 0 10*3/uL (ref 0.0–0.2)
Basos: 0 %
EOS (ABSOLUTE): 0.1 10*3/uL (ref 0.0–0.4)
Eos: 1 %
HEMATOCRIT: 35.7 % (ref 34.0–46.6)
Hemoglobin: 11.3 g/dL (ref 11.1–15.9)
Immature Grans (Abs): 0 10*3/uL (ref 0.0–0.1)
Immature Granulocytes: 0 %
LYMPHS ABS: 1.5 10*3/uL (ref 0.7–3.1)
Lymphs: 18 %
MCH: 22.1 pg — ABNORMAL LOW (ref 26.6–33.0)
MCHC: 31.7 g/dL (ref 31.5–35.7)
MCV: 70 fL — ABNORMAL LOW (ref 79–97)
MONOS ABS: 0.8 10*3/uL (ref 0.1–0.9)
Monocytes: 10 %
NEUTROS PCT: 71 %
Neutrophils Absolute: 6 10*3/uL (ref 1.4–7.0)
Platelets: 293 10*3/uL (ref 150–379)
RBC: 5.12 x10E6/uL (ref 3.77–5.28)
RDW: 16.4 % — AB (ref 12.3–15.4)
WBC: 8.4 10*3/uL (ref 3.4–10.8)

## 2015-11-27 LAB — SICKLE CELL SCREEN: SICKLE CELL SCREEN: NEGATIVE

## 2015-11-27 LAB — POCT URINALYSIS DIPSTICK
Bilirubin, UA: NEGATIVE
GLUCOSE UA: NEGATIVE
KETONES UA: NEGATIVE
LEUKOCYTES UA: NEGATIVE
Nitrite, UA: NEGATIVE
PROTEIN UA: NEGATIVE
SPEC GRAV UA: 1.01
Urobilinogen, UA: NEGATIVE
pH, UA: 7.5

## 2015-11-27 LAB — HEPATITIS B SURFACE ANTIGEN: Hepatitis B Surface Ag: NEGATIVE

## 2015-11-27 LAB — RPR: RPR Ser Ql: NONREACTIVE

## 2015-11-27 LAB — HIV ANTIBODY (ROUTINE TESTING W REFLEX): HIV SCREEN 4TH GENERATION: NONREACTIVE

## 2015-11-27 NOTE — Progress Notes (Signed)
OBSTETRIC INITIAL PRENATAL VISIT  Subjective:    Krista Rich is being seen today for her first obstetrical visit.  This is not a planned pregnancy. She is a 25 y.o. [id number] female at 2020w6d gestation, Estimated Date of Delivery: 05/28/16 with Patient's last menstrual period was 08/30/2015 (approximate), consistent with 11 week sono. Her obstetrical history is significant for smoker and h/o preterm delivery. Relationship with FOB: spouse, living together. Patient is unsure of inten to breast feed. Pregnancy history fully reviewed.   Obstetric History   G3   P2   T1   P1   A0   L2    SAB0   TAB0   Ectopic0   Multiple0   Live Births2     # Outcome Date GA Lbr Len/2nd Weight Sex Delivery Anes PTL Lv  3 Current           2 Preterm 08/25/14 [redacted]w[redacted]d  5 lb 7.8 oz (2.49 kg) M Vag-Spont EPI  LIV     Apgar1:  9                Apgar5: 9  1 Term 10/26/10 6547w0d  6 lb 12 oz (3.062 kg) F Vag-Spont  N LIV      Gynecologic History:  Last pap smear was: patient cannot recall date but thinks it was several years ago.  Results were normal.  Denies h/o abnormal pap smears in the past.  Denies history of STIs.    No past medical history on file.   Family History  Problem Relation Age of Onset  . Hyperlipidemia Mother     No past surgical history on file.   Social History   Social History  . Marital status: Married    Spouse name: N/A  . Number of children: N/A  . Years of education: N/A   Occupational History  . Not on file.   Social History Main Topics  . Smoking status: Current Some Day Smoker    Packs/day: 0.25    Types: Cigarettes  . Smokeless tobacco: Never Used  . Alcohol use No  . Drug use: No  . Sexual activity: Yes    Birth control/ protection: None     Comment: Pregnant    Other Topics Concern  . Not on file   Social History Narrative  . No narrative on file     Current Outpatient Prescriptions on File Prior to Visit  Medication Sig Dispense Refill  .  Prenat-FeFmCb-DSS-FA-DHA w/o A (CITRANATAL HARMONY) 27-1-260 MG CAPS Take 27 mg by mouth daily. 30 capsule 11   No current facility-administered medications on file prior to visit.     No Known Allergies   Review of Systems General:Not Present- Fever, Weight Loss and Weight Gain. Skin:Not Present- Rash. HEENT:Not Present- Blurred Vision, Headache and Bleeding Gums. Respiratory:Not Present- Difficulty Breathing. Breast:Not Present- Breast Mass. Cardiovascular:Not Present- Chest Pain, Elevated Blood Pressure, Fainting / Blacking Out and Shortness of Breath. Gastrointestinal:Not Present- Abdominal Pain, Constipation, Nausea and Vomiting. Female Genitourinary:Present - vaginal spotting, bright pink when wiping this morning. Not Present- Frequency, Painful Urination, Pelvic Pain, Vaginal Bleeding, Vaginal Discharge, Contractions, regular, Fetal Movements Decreased, Urinary Complaints and Vaginal Fluid. Musculoskeletal:Not Present- Back Pain and Leg Cramps. Neurological:Not Present- Dizziness. Psychiatric:Not Present- Depression.     Objective:   Blood pressure 100/69, pulse 96, weight 116 lb (52.6 kg), last menstrual period 08/30/2015, unknown if currently breastfeeding.  Body mass index is 19.3 kg/m.   General Appearance:    Alert,  cooperative, no distress, appears stated age  Head:    Normocephalic, without obvious abnormality, atraumatic  Eyes:    PERRL, conjunctiva/corneas clear, EOM's intact, both eyes  Ears:    Normal external ear canals, both ears  Nose:   Nares normal, septum midline, mucosa normal, no drainage or sinus tenderness  Throat:   Lips, mucosa, and tongue normal; teeth and gums normal  Neck:   Supple, symmetrical, trachea midline, no adenopathy; thyroid: no enlargement/tenderness/nodules; no carotid bruit or JVD  Back:     Symmetric, no curvature, ROM normal, no CVA tenderness  Lungs:     Clear to auscultation bilaterally, respirations unlabored  Chest  Wall:    No tenderness or deformity   Heart:    Regular rate and rhythm, S1 and S2 normal, no murmur, rub or gallop  Breast Exam:    No tenderness, masses, or nipple abnormality  Abdomen:     Soft, non-tender, bowel sounds active all four quadrants, no masses, no organomegaly.  FH 14.  FHT 155 bpm.  Genitalia:    Pelvic:external genitalia normal, vagina without lesions, discharge, or tenderness, rectovaginal septum  normal. Cervix normal in appearance, no cervical motion tenderness, no adnexal masses or tenderness.  Pregnancy positive findings: uterine enlargement: 14 wk size, nontender.   Rectal:    Normal external sphincter.  No hemorrhoids appreciated. Internal exam not done.   Extremities:   Extremities normal, atraumatic, no cyanosis or edema  Pulses:   2+ and symmetric all extremities  Skin:   Skin color, texture, turgor normal, no rashes or lesions  Lymph nodes:   Cervical, supraclavicular, and axillary nodes normal  Neurologic:   CNII-XII intact, normal strength, sensation and reflexes throughout      Assessment:    Pregnancy at 13 and 6/7 weeks   H/o tobacco abuse  H/o preterm delivery x 1 Vaginal spotting in pregnancy (h/o subchorionic hematoma) Cervical cancer screening  Plan:    Initial labs reviewed. Pap smear performed today Prenatal vitamins encouraged. Problem list reviewed and updated. New OB counseling:  The patient has been given an overview regarding routine prenatal care.  Recommendations regarding diet, weight gain, and exercise in pregnancy were given. Prenatal testing, optional genetic testing, and ultrasound use in pregnancy were reviewed.  AFP3 discussed: ordered. Benefits of Breast Feeding were discussed. The patient is encouraged to consider nursing her baby post partum. Discussed smoking cessation techniques with patient.  Currently smoking 3-4 cig daily.   H/o preterm delivery at 36 weeks, discussed 17-OHP injections for prevention of preterm delivery in  current pregnancy.  Patient unsure, desires to think about it.  Will reassess next visit.  Vaginal spotting with FHT present today.  Discussed bleeding precautions. Is for NT scan today, can also check fetal status and placenta.  Follow up in 4 weeks.  50% of 30 min visit spent on counseling and coordination of care.     Hildred LaserAnika Karrington Mccravy, MD [facility]

## 2015-11-28 LAB — GC/CHLAMYDIA PROBE AMP
Chlamydia trachomatis, NAA: NEGATIVE
NEISSERIA GONORRHOEAE BY PCR: NEGATIVE

## 2015-11-28 LAB — URINALYSIS, ROUTINE W REFLEX MICROSCOPIC
Bilirubin, UA: NEGATIVE
Ketones, UA: NEGATIVE
NITRITE UA: NEGATIVE
PH UA: 7 (ref 5.0–7.5)
Protein, UA: NEGATIVE
RBC, UA: NEGATIVE
Specific Gravity, UA: 1.01 (ref 1.005–1.030)
UUROB: 0.2 mg/dL (ref 0.2–1.0)

## 2015-11-28 LAB — MONITOR DRUG PROFILE 14(MW)
Amphetamine Scrn, Ur: NEGATIVE ng/mL
BARBITURATE SCREEN URINE: NEGATIVE ng/mL
BENZODIAZEPINE SCREEN, URINE: NEGATIVE ng/mL
BUPRENORPHINE, URINE: NEGATIVE ng/mL
CANNABINOIDS UR QL SCN: NEGATIVE ng/mL
COCAINE(METAB.)SCREEN, URINE: NEGATIVE ng/mL
CREATININE(CRT), U: 48.6 mg/dL (ref 20.0–300.0)
Fentanyl, Urine: NEGATIVE pg/mL
MEPERIDINE SCREEN, URINE: NEGATIVE ng/mL
METHADONE SCREEN, URINE: NEGATIVE ng/mL
OPIATE SCREEN URINE: NEGATIVE ng/mL
OXYCODONE+OXYMORPHONE UR QL SCN: NEGATIVE ng/mL
PHENCYCLIDINE QUANTITATIVE URINE: NEGATIVE ng/mL
Ph of Urine: 6.8 (ref 4.5–8.9)
Propoxyphene Scrn, Ur: NEGATIVE ng/mL
SPECIFIC GRAVITY: 1.004
TRAMADOL SCREEN, URINE: NEGATIVE ng/mL

## 2015-11-28 LAB — PAP IG, CT-NG, RFX HPV ASCU
Chlamydia, Nuc. Acid Amp: NEGATIVE
Gonococcus by Nucleic Acid Amp: NEGATIVE
PAP Smear Comment: 0

## 2015-11-28 LAB — URINE CULTURE, OB REFLEX

## 2015-11-28 LAB — MICROSCOPIC EXAMINATION: CASTS: NONE SEEN /LPF

## 2015-11-28 LAB — NICOTINE SCREEN, URINE: COTININE UR QL SCN: NEGATIVE ng/mL

## 2015-11-28 LAB — CULTURE, OB URINE

## 2015-11-29 LAB — FIRST TRIMESTER SCREEN W/NT
CRL: 75.5 mm
DIA MoM: 1
DIA Value: 267.4 pg/mL
Gest Age-Collect: 13.1 weeks
HCG MOM: 0.58
HCG VALUE: 54.8 [IU]/mL
MATERNAL AGE AT EDD: 24.7 a
NUCHAL TRANSLUCENCY MOM: 0.76
NUCHAL TRANSLUCENCY: 1.4 mm
Number of Fetuses: 1
PAPP-A MOM: 1.4
PAPP-A VALUE: 2265.9 ng/mL
PDF: 0
Test Results:: NEGATIVE
WEIGHT: 116 [lb_av]

## 2015-12-25 ENCOUNTER — Encounter: Payer: 59 | Admitting: Obstetrics and Gynecology

## 2015-12-26 ENCOUNTER — Ambulatory Visit (INDEPENDENT_AMBULATORY_CARE_PROVIDER_SITE_OTHER): Payer: 59 | Admitting: Obstetrics and Gynecology

## 2015-12-26 VITALS — BP 104/62 | HR 96 | Wt 124.9 lb

## 2015-12-26 DIAGNOSIS — Z3492 Encounter for supervision of normal pregnancy, unspecified, second trimester: Secondary | ICD-10-CM

## 2015-12-26 DIAGNOSIS — O09212 Supervision of pregnancy with history of pre-term labor, second trimester: Secondary | ICD-10-CM

## 2015-12-26 DIAGNOSIS — O09892 Supervision of other high risk pregnancies, second trimester: Secondary | ICD-10-CM

## 2015-12-26 DIAGNOSIS — Z3482 Encounter for supervision of other normal pregnancy, second trimester: Secondary | ICD-10-CM

## 2015-12-26 LAB — POCT URINALYSIS DIPSTICK
Bilirubin, UA: NEGATIVE
GLUCOSE UA: NEGATIVE
Ketones, UA: NEGATIVE
NITRITE UA: NEGATIVE
PROTEIN UA: NEGATIVE
Spec Grav, UA: >=1.03
UROBILINOGEN UA: NEGATIVE
pH, UA: 6

## 2015-12-26 NOTE — Progress Notes (Signed)
ROB: C/o left sided groin pain before bed and upon wakening.  Notes it goes away once she begins moving. Discussed 17-OHP for prior preterm delivery (at 36 weeks), patient declines.  For anatomy scan in 2 weeks, ROB visit in 4 weeks.

## 2016-01-09 ENCOUNTER — Ambulatory Visit (INDEPENDENT_AMBULATORY_CARE_PROVIDER_SITE_OTHER): Payer: 59

## 2016-01-09 DIAGNOSIS — Z3492 Encounter for supervision of normal pregnancy, unspecified, second trimester: Secondary | ICD-10-CM

## 2016-01-24 ENCOUNTER — Ambulatory Visit (INDEPENDENT_AMBULATORY_CARE_PROVIDER_SITE_OTHER): Payer: 59 | Admitting: Obstetrics and Gynecology

## 2016-01-24 ENCOUNTER — Encounter: Payer: 59 | Admitting: Obstetrics and Gynecology

## 2016-01-24 VITALS — BP 101/70 | HR 71 | Wt 132.8 lb

## 2016-01-24 DIAGNOSIS — Z23 Encounter for immunization: Secondary | ICD-10-CM | POA: Diagnosis not present

## 2016-01-24 DIAGNOSIS — Z3482 Encounter for supervision of other normal pregnancy, second trimester: Secondary | ICD-10-CM | POA: Insufficient documentation

## 2016-01-24 DIAGNOSIS — O4440 Low lying placenta NOS or without hemorrhage, unspecified trimester: Secondary | ICD-10-CM

## 2016-01-24 LAB — POCT URINALYSIS DIPSTICK
Bilirubin, UA: NEGATIVE
Glucose, UA: NEGATIVE
KETONES UA: NEGATIVE
NITRITE UA: NEGATIVE
PH UA: 8.5
PROTEIN UA: NEGATIVE
RBC UA: NEGATIVE
Spec Grav, UA: <=1.005
Urobilinogen, UA: NEGATIVE

## 2016-01-24 NOTE — Progress Notes (Signed)
ROB: Denies complaints.  Flu vaccine given today.  Low lying placenta noted on anatomy scan. RTC 4 weeks, for 28 week labs and f/u scan.

## 2016-02-21 ENCOUNTER — Other Ambulatory Visit: Payer: 59

## 2016-02-21 ENCOUNTER — Ambulatory Visit (INDEPENDENT_AMBULATORY_CARE_PROVIDER_SITE_OTHER): Payer: 59

## 2016-02-21 ENCOUNTER — Ambulatory Visit (INDEPENDENT_AMBULATORY_CARE_PROVIDER_SITE_OTHER): Payer: 59 | Admitting: Obstetrics and Gynecology

## 2016-02-21 ENCOUNTER — Other Ambulatory Visit: Payer: Self-pay | Admitting: Obstetrics and Gynecology

## 2016-02-21 VITALS — BP 108/57 | HR 88 | Wt 133.6 lb

## 2016-02-21 DIAGNOSIS — Z3482 Encounter for supervision of other normal pregnancy, second trimester: Secondary | ICD-10-CM | POA: Diagnosis not present

## 2016-02-21 DIAGNOSIS — Z23 Encounter for immunization: Secondary | ICD-10-CM | POA: Diagnosis not present

## 2016-02-21 DIAGNOSIS — N941 Unspecified dyspareunia: Secondary | ICD-10-CM

## 2016-02-21 DIAGNOSIS — Z131 Encounter for screening for diabetes mellitus: Secondary | ICD-10-CM

## 2016-02-21 DIAGNOSIS — O4440 Low lying placenta NOS or without hemorrhage, unspecified trimester: Secondary | ICD-10-CM

## 2016-02-21 DIAGNOSIS — Z308 Encounter for other contraceptive management: Secondary | ICD-10-CM

## 2016-02-21 LAB — POCT URINALYSIS DIPSTICK
BILIRUBIN UA: NEGATIVE
GLUCOSE UA: NEGATIVE
KETONES UA: NEGATIVE
Leukocytes, UA: NEGATIVE
Nitrite, UA: NEGATIVE
Protein, UA: NEGATIVE
RBC UA: NEGATIVE
SPEC GRAV UA: 1.01
UROBILINOGEN UA: NEGATIVE
pH, UA: 7

## 2016-02-21 MED ORDER — TETANUS-DIPHTH-ACELL PERTUSSIS 5-2.5-18.5 LF-MCG/0.5 IM SUSP
0.5000 mL | Freq: Once | INTRAMUSCULAR | Status: AC
  Administered 2016-02-21: 0.5 mL via INTRAMUSCULAR

## 2016-02-21 NOTE — Progress Notes (Signed)
ZOX:[id number]:Complains of dyspareunia, states she feels like her partner is "hitting something inside her".  Declines exam. Discussed that baby may be sitting low in pelvis, may need to consider alternative sex positions for comfort. For 28 week labs today.  Desires to bottle feed.  Discussed benefits of breastfeeding. Desires permanent sterilization for contraception. Lenghty discussion had on patient's risk factors for regret, most specifically her age.  Discussed other forms of contraception, including LARC.  Patient still desires for BTL.  Notes she and husband have discussed.  Risks of procedure discussed with patient including but not limited to: permanence of method, bleeding, infection, injury to surrounding organs and need for additional procedures.  Failure risk of 1-2 % with increased risk of ectopic gestation if pregnancy occurs was also discussed with patient. Will continue further discussion with pt at future visits. For Tdap today, signed blood consent, discussed cord blood banking. RTC in 2 weeks.

## 2016-02-22 LAB — GLUCOSE, 1 HOUR GESTATIONAL: GESTATIONAL DIABETES SCREEN: 112 mg/dL (ref 65–139)

## 2016-02-22 LAB — HEMOGLOBIN AND HEMATOCRIT, BLOOD
Hematocrit: 29.7 % — ABNORMAL LOW (ref 34.0–46.6)
Hemoglobin: 9.7 g/dL — ABNORMAL LOW (ref 11.1–15.9)

## 2016-02-27 ENCOUNTER — Emergency Department
Admission: EM | Admit: 2016-02-27 | Discharge: 2016-02-27 | Disposition: A | Discharge status: Home / Self Care | Payer: 59 | Attending: Emergency Medicine | Admitting: Emergency Medicine

## 2016-02-27 DIAGNOSIS — K0889 Other specified disorders of teeth and supporting structures: Secondary | ICD-10-CM | POA: Diagnosis not present

## 2016-02-27 DIAGNOSIS — Z5321 Procedure and treatment not carried out due to patient leaving prior to being seen by health care provider: Secondary | ICD-10-CM | POA: Diagnosis not present

## 2016-02-27 DIAGNOSIS — F17210 Nicotine dependence, cigarettes, uncomplicated: Secondary | ICD-10-CM | POA: Insufficient documentation

## 2016-02-27 NOTE — ED Triage Notes (Addendum)
Pt in with co toothache x few days. Pt is approx 6 months pregnant with no pregnancy complaints.

## 2016-03-13 ENCOUNTER — Encounter: Payer: 59 | Admitting: Obstetrics and Gynecology

## 2016-04-07 NOTE — L&D Delivery Note (Signed)
Delivery Summary for Krista PlumberHeather I Kaluzny  Labor Events:   Preterm labor:   Rupture date:   Rupture time:   Rupture type: Spontaneous  Fluid Color: Clear  Induction:   Augmentation:   Complications:   Cervical ripening:          Delivery:   Episiotomy:   Lacerations:   Repair suture:   Repair # of packets:   Blood loss (ml): 200   Information for the patient's newborn:  Gaylyn CheersLee, Boy Xoie [[id number]][[id number]]    Delivery 05/08/2016 12:38 AM by  Vaginal, Spontaneous Delivery Sex:  female Gestational Age: 3165w1d Delivery Clinician:   Living?:         APGARS  One minute Five minutes Ten minutes  Skin color:        Heart rate:        Grimace:        Muscle tone:        Breathing:        Totals: 8  9      Presentation/position:      Resuscitation:   Cord information:    Disposition of cord blood:     Blood gases sent?  Complications:   Placenta: Delivered:       appearance Newborn Measurements: Weight: 6 lb 6.7 oz (2910 g)  Height: 18.9"  Head circumference:    Chest circumference:    Other providers:    Additional  information: Forceps:   Vacuum:   Breech:   Observed anomalies        Delivery Note At 12:38 AM a viable and healthy female was delivered via Vaginal, Precipitous Spontaneous Delivery (Presentation: Vertex; LOA position ).  APGAR: 8, 9; weight  2910 grams.   Placenta status: intact, spontaneously removed.  Cord: 3-vessel, with the following complications: None.  Cord pH: not obtained.   Anesthesia:  Epidural Episiotomy: None Lacerations: 1st degree Suture Repair: 2.0 vicryl Est. Blood Loss (mL): 200  Mom to postpartum.  Baby to Couplet care / Skin to Skin.  Cornel Werber 05/08/2016, 1:10 AM

## 2016-05-07 ENCOUNTER — Inpatient Hospital Stay: Payer: Medicaid Other | Admitting: Anesthesiology

## 2016-05-07 ENCOUNTER — Encounter: Payer: Self-pay | Admitting: *Deleted

## 2016-05-07 ENCOUNTER — Inpatient Hospital Stay
Admission: EM | Admit: 2016-05-07 | Discharge: 2016-05-09 | DRG: 767 | Disposition: A | Discharge status: Home / Self Care | Payer: Medicaid Other | Attending: Obstetrics and Gynecology | Admitting: Obstetrics and Gynecology

## 2016-05-07 ENCOUNTER — Ambulatory Visit (INDEPENDENT_AMBULATORY_CARE_PROVIDER_SITE_OTHER): Payer: 59 | Admitting: Obstetrics and Gynecology

## 2016-05-07 VITALS — BP 130/74 | HR 90 | Wt 149.9 lb

## 2016-05-07 DIAGNOSIS — O4202 Full-term premature rupture of membranes, onset of labor within 24 hours of rupture: Secondary | ICD-10-CM | POA: Diagnosis present

## 2016-05-07 DIAGNOSIS — Z3A37 37 weeks gestation of pregnancy: Secondary | ICD-10-CM | POA: Diagnosis not present

## 2016-05-07 DIAGNOSIS — Z113 Encounter for screening for infections with a predominantly sexual mode of transmission: Secondary | ICD-10-CM

## 2016-05-07 DIAGNOSIS — O99334 Smoking (tobacco) complicating childbirth: Secondary | ICD-10-CM | POA: Diagnosis present

## 2016-05-07 DIAGNOSIS — Z3483 Encounter for supervision of other normal pregnancy, third trimester: Secondary | ICD-10-CM

## 2016-05-07 DIAGNOSIS — O09899 Supervision of other high risk pregnancies, unspecified trimester: Secondary | ICD-10-CM

## 2016-05-07 DIAGNOSIS — Z3685 Encounter for antenatal screening for Streptococcus B: Secondary | ICD-10-CM

## 2016-05-07 DIAGNOSIS — O0933 Supervision of pregnancy with insufficient antenatal care, third trimester: Secondary | ICD-10-CM

## 2016-05-07 DIAGNOSIS — O4290 Premature rupture of membranes, unspecified as to length of time between rupture and onset of labor, unspecified weeks of gestation: Secondary | ICD-10-CM | POA: Diagnosis present

## 2016-05-07 DIAGNOSIS — O9902 Anemia complicating childbirth: Secondary | ICD-10-CM | POA: Diagnosis present

## 2016-05-07 DIAGNOSIS — F17210 Nicotine dependence, cigarettes, uncomplicated: Secondary | ICD-10-CM | POA: Diagnosis present

## 2016-05-07 DIAGNOSIS — O09219 Supervision of pregnancy with history of pre-term labor, unspecified trimester: Secondary | ICD-10-CM

## 2016-05-07 DIAGNOSIS — D649 Anemia, unspecified: Secondary | ICD-10-CM | POA: Diagnosis present

## 2016-05-07 DIAGNOSIS — Z302 Encounter for sterilization: Secondary | ICD-10-CM

## 2016-05-07 DIAGNOSIS — O99013 Anemia complicating pregnancy, third trimester: Secondary | ICD-10-CM | POA: Diagnosis present

## 2016-05-07 HISTORY — DX: Other specified health status: Z78.9

## 2016-05-07 LAB — CHLAMYDIA/NGC RT PCR (ARMC ONLY)
Chlamydia Tr: NOT DETECTED
N gonorrhoeae: NOT DETECTED

## 2016-05-07 LAB — TYPE AND SCREEN
ABO/RH(D): O POS
Antibody Screen: NEGATIVE

## 2016-05-07 LAB — CBC
HCT: 26.3 % — ABNORMAL LOW (ref 35.0–47.0)
Hemoglobin: 8.6 g/dL — ABNORMAL LOW (ref 12.0–16.0)
MCH: 22.4 pg — AB (ref 26.0–34.0)
MCHC: 32.6 g/dL (ref 32.0–36.0)
MCV: 69 fL — ABNORMAL LOW (ref 80.0–100.0)
PLATELETS: 236 10*3/uL (ref 150–440)
RBC: 3.82 MIL/uL (ref 3.80–5.20)
RDW: 13.7 % (ref 11.5–14.5)
WBC: 8.8 10*3/uL (ref 3.6–11.0)

## 2016-05-07 LAB — RAPID HIV SCREEN (HIV 1/2 AB+AG)
HIV 1/2 ANTIBODIES: NONREACTIVE
HIV-1 P24 Antigen - HIV24: NONREACTIVE

## 2016-05-07 LAB — POCT URINALYSIS DIPSTICK
Bilirubin, UA: NEGATIVE
Glucose, UA: NEGATIVE
Ketones, UA: NEGATIVE
Nitrite, UA: NEGATIVE
PROTEIN UA: NEGATIVE
Spec Grav, UA: 1.02
Urobilinogen, UA: NEGATIVE
pH, UA: 7.5

## 2016-05-07 MED ORDER — LACTATED RINGERS IV SOLN: 500.0000 mL | INTRAVENOUS | Status: DC | PRN

## 2016-05-07 MED ORDER — TERBUTALINE SULFATE 1 MG/ML IJ SOLN: 0.2500 mg | Freq: Once | INTRAMUSCULAR | Status: DC | PRN

## 2016-05-07 MED ORDER — AMMONIA AROMATIC IN INHA
RESPIRATORY_TRACT | Status: AC
  Filled 2016-05-07: qty 10

## 2016-05-07 MED ORDER — BUTORPHANOL TARTRATE 1 MG/ML IJ SOLN: 1.0000 mg | INTRAMUSCULAR | Status: DC | PRN

## 2016-05-07 MED ORDER — OXYTOCIN 10 UNIT/ML IJ SOLN
INTRAMUSCULAR | Status: AC
  Filled 2016-05-07: qty 2

## 2016-05-07 MED ORDER — SOD CITRATE-CITRIC ACID 500-334 MG/5ML PO SOLN: 30.0000 mL | ORAL | Status: DC | PRN

## 2016-05-07 MED ORDER — LACTATED RINGERS IV SOLN
INTRAVENOUS | Status: DC
  Administered 2016-05-07: 19:00:00 via INTRAVENOUS

## 2016-05-07 MED ORDER — OXYCODONE-ACETAMINOPHEN 5-325 MG PO TABS: 1.0000 | ORAL_TABLET | ORAL | Status: DC | PRN

## 2016-05-07 MED ORDER — LIDOCAINE HCL (PF) 1 % IJ SOLN
INTRAMUSCULAR | Status: AC
  Filled 2016-05-07: qty 30

## 2016-05-07 MED ORDER — ONDANSETRON HCL 4 MG/2ML IJ SOLN: 4.0000 mg | Freq: Four times a day (QID) | INTRAMUSCULAR | Status: DC | PRN

## 2016-05-07 MED ORDER — OXYTOCIN 40 UNITS IN LACTATED RINGERS INFUSION - SIMPLE MED
1.0000 m[IU]/min | INTRAVENOUS | Status: DC
  Administered 2016-05-07: 1 m[IU]/min via INTRAVENOUS

## 2016-05-07 MED ORDER — BUPIVACAINE HCL (PF) 0.25 % IJ SOLN
INTRAMUSCULAR | Status: DC | PRN
Start: 2016-05-07 — End: 2016-05-08
  Administered 2016-05-07: 10 mL via EPIDURAL

## 2016-05-07 MED ORDER — OXYTOCIN BOLUS FROM INFUSION: 500.0000 mL | Freq: Once | INTRAVENOUS | Status: DC

## 2016-05-07 MED ORDER — OXYTOCIN 40 UNITS IN LACTATED RINGERS INFUSION - SIMPLE MED
2.5000 [IU]/h | INTRAVENOUS | Status: DC
  Filled 2016-05-07: qty 1000

## 2016-05-07 MED ORDER — LIDOCAINE HCL (PF) 1 % IJ SOLN
INTRAMUSCULAR | Status: DC | PRN
  Administered 2016-05-07: 3 mL

## 2016-05-07 MED ORDER — ACETAMINOPHEN 325 MG PO TABS: 650.0000 mg | ORAL_TABLET | ORAL | Status: DC | PRN

## 2016-05-07 MED ORDER — MISOPROSTOL 200 MCG PO TABS
ORAL_TABLET | ORAL | Status: AC
  Filled 2016-05-07: qty 4

## 2016-05-07 MED ORDER — LIDOCAINE-EPINEPHRINE (PF) 1.5 %-1:200000 IJ SOLN
INTRAMUSCULAR | Status: DC | PRN
  Administered 2016-05-07: 3 mL via PERINEURAL

## 2016-05-07 MED ORDER — LIDOCAINE HCL (PF) 1 % IJ SOLN: 30.0000 mL | INTRAMUSCULAR | Status: DC | PRN

## 2016-05-07 MED ORDER — OXYCODONE-ACETAMINOPHEN 5-325 MG PO TABS: 2.0000 | ORAL_TABLET | ORAL | Status: DC | PRN

## 2016-05-07 MED ORDER — OXYTOCIN 40 UNITS IN LACTATED RINGERS INFUSION - SIMPLE MED: 1.0000 m[IU]/min | INTRAVENOUS | Status: DC

## 2016-05-07 NOTE — Anesthesia Preprocedure Evaluation (Signed)
Anesthesia Evaluation  Patient identified by MRN, date of birth, ID band Patient awake    Reviewed: Allergy & Precautions, H&P , NPO status , Patient's Chart, lab work & pertinent test results  History of Anesthesia Complications Negative for: history of anesthetic complications  Airway Mallampati: II       Dental no notable dental hx.    Pulmonary neg pulmonary ROS, Current Smoker, former smoker,    Pulmonary exam normal        Cardiovascular negative cardio ROS Normal cardiovascular exam     Neuro/Psych negative neurological ROS  negative psych ROS   GI/Hepatic negative GI ROS, Neg liver ROS,   Endo/Other  negative endocrine ROS  Renal/GU negative Renal ROS  negative genitourinary   Musculoskeletal negative musculoskeletal ROS (+)   Abdominal   Peds negative pediatric ROS (+)  Hematology negative hematology ROS (+) anemia ,   Anesthesia Other Findings   Reproductive/Obstetrics (+) Pregnancy                             Anesthesia Physical  Anesthesia Plan  ASA: II  Anesthesia Plan: Epidural   Post-op Pain Management:    Induction:   Airway Management Planned: Natural Airway  Additional Equipment:   Intra-op Plan:   Post-operative Plan:   Informed Consent: I have reviewed the patients History and Physical, chart, labs and discussed the procedure including the risks, benefits and alternatives for the proposed anesthesia with the patient or authorized representative who has indicated his/her understanding and acceptance.   Dental advisory given  Plan Discussed with: CRNA and Surgeon  Anesthesia Plan Comments:         Anesthesia Quick Evaluation

## 2016-05-07 NOTE — Progress Notes (Signed)
ROB: Patient notes occasional ctx.  Notes she has been working, and just hasn't been able to come to appointments.  36 week labs done today. RTC in 1 week unless delivered.

## 2016-05-07 NOTE — H&P (Signed)
Obstetric History and Physical  Krista Rich is a 26 y.o. [phone] with IUP at [redacted]w[redacted]d presenting for complaints of leaking fluid since 4:30 p.m. today. Patient states she has been having  irregular contractions (non-painful), minimal vaginal bleeding, ruptured, clear fluid membranes, with active fetal movement.  Patient was seen in clinic earlier today without complaints.   Prenatal Course Source of Care: [facility] with onset of care at 13 weeks Pregnancy complications or risks: Patient Active Problem List   Diagnosis Date Noted  . Insufficient prenatal care in third trimester 05/07/2016  . PROM (premature rupture of membranes) 05/07/2016  . Anemia of pregnancy in third trimester 05/07/2016  . Dyspareunia in female 02/21/2016  . Low-lying placenta 01/24/2016  . Supervision of normal intrauterine pregnancy in multigravida in second trimester 01/24/2016  . H/O preterm delivery, currently pregnant 11/27/2015  . Tobacco abuse 11/20/2015  . Subchorionic hematoma in first trimester, antepartum 11/20/2015   She plans to bottle feed She desires bilateral tubal ligation for postpartum contraception.   Prenatal labs and studies: ABO, Rh: --/--/O POS (01/31 1837) Antibody: NEG (01/31 1837) Rubella:   RPR: Non Reactive (08/21 1606)  HBsAg: Negative (08/21 1606)  HIV: Non Reactive (08/21 1606)  GBS:  1 hr Glucola  normal Genetic screening normal Anatomy US normal  Past Medical History:  Diagnosis Date  . Medical history non-contributory     Past Surgical History:  Procedure Laterality Date  . NO PAST SURGERIES      OB History  Gravida Para Term Preterm AB Living  3 2 1 1   2   SAB TAB Ectopic Multiple Live Births        0 2    # Outcome Date GA Lbr Len/2nd Weight Sex Delivery Anes PTL Lv  3 Current           2 Preterm 08/25/14 [redacted]w[redacted]d  5 lb 7.8 oz (2.49 kg) M Vag-Spont EPI  LIV  1 Term 10/26/10 [redacted]w[redacted]d  6 lb 12 oz (3.062 kg) F Vag-Spont  N LIV      Social History    Social History  . Marital status: Married    Spouse name: N/A  . Number of children: N/A  . Years of education: N/A   Social History Main Topics  . Smoking status: Current Some Day Smoker    Packs/day: 0.25    Types: Cigarettes  . Smokeless tobacco: Never Used  . Alcohol use No  . Drug use: No  . Sexual activity: Yes    Birth control/ protection: None     Comment: Pregnant    Other Topics Concern  . None   Social History Narrative  . None    Family History  Problem Relation Age of Onset  . Hyperlipidemia Mother     Prescriptions Prior to Admission  Medication Sig Dispense Refill Last Dose  . Prenat-FeFmCb-DSS-FA-DHA w/o A (CITRANATAL HARMONY) 27-1-260 MG CAPS Take 27 mg by mouth daily. 30 capsule 11 05/07/2016 at Unknown time    No Known Allergies  Review of Systems: Negative except for what is mentioned in HPI.  Physical Exam: BP 97/60   Pulse 88   Temp 98.4 F (36.9 C) (Oral)   Resp 18   Ht 5\' 5"  (1.651 m)   Wt 150 lb (68 kg)   LMP 08/30/2015 (Approximate)   BMI 24.96 kg/m  CONSTITUTIONAL: Well-developed, well-nourished female in no acute distress.  HENT:  Normocephalic, atraumatic, External right and left ear normal. Oropharynx is clear  and moist EYES: Conjunctivae and EOM are normal. Pupils are equal, round, and reactive to light. No scleral icterus.  NECK: Normal range of motion, supple, no masses SKIN: Skin is warm and dry. No rash noted. Not diaphoretic. No erythema. No pallor. NEUROLOGIC: Alert and oriented to person, place, and time. Normal reflexes, muscle tone coordination. No cranial nerve deficit noted. PSYCHIATRIC: Normal mood and affect. Normal behavior. Normal judgment and thought content. CARDIOVASCULAR: Normal heart rate noted, regular rhythm RESPIRATORY: Effort and breath sounds normal, no problems with respiration noted ABDOMEN: Soft, nontender, nondistended, gravid. MUSCULOSKELETAL: Normal range of motion. No edema and no tenderness.  2+ distal pulses.  Cervical Exam: Dilatation 4 cm   Effacement 70%   Station -1   Presentation: cephalic FHT:  Baseline rate 140 bpm   Variability moderate  Accelerations present   Decelerations none Contractions: Every 2-6 mins   Pertinent Labs/Studies:   Results for orders placed or performed during the hospital encounter of 05/07/16 (from the past 24 hour(s))  CBC     Status: Abnormal   Collection Time: 05/07/16  6:37 PM  Result Value Ref Range   WBC 8.8 3.6 - 11.0 K/uL   RBC 3.82 3.80 - 5.20 MIL/uL   Hemoglobin 8.6 (L) 12.0 - 16.0 g/dL   HCT 04.526.3 (L) 40.935.0 - 81.147.0 %   MCV 69.0 (L) 80.0 - 100.0 fL   MCH 22.4 (L) 26.0 - 34.0 pg   MCHC 32.6 32.0 - 36.0 g/dL   RDW 91.413.7 78.211.5 - 95.614.5 %   Platelets 236 150 - 440 K/uL  Type and screen [facility]     Status: None   Collection Time: 05/07/16  6:37 PM  Result Value Ref Range   ABO/RH(D) O POS    Antibody Screen NEG    Sample Expiration 05/10/2016   Rapid HIV screen (HIV 1/2 Ab+Ag) ([facility] Only)     Status: None   Collection Time: 05/07/16  6:37 PM  Result Value Ref Range   HIV-1 P24 Antigen - HIV24 NON REACTIVE NON REACTIVE   HIV 1/2 Antibodies NON REACTIVE NON REACTIVE   Interpretation (HIV Ag Ab)      A non reactive test result means that HIV 1 or HIV 2 antibodies and HIV 1 p24 antigen were not detected in the specimen.    Assessment : Krista Rich is a 26 y.o. [phone] at 9473w0d being admitted for PROM. Also with insufficient PNC in 3rd trimester, and moderate anemia.   Plan: Labor: Expectant management.  Augmentation as needed, per protocol FWB: Reassuring fetal heart tracing.  GBS unknown.  Collected today. Does not require GBS prophylaxis at this time.  Delivery plan: Hopeful for vaginal delivery.     Krista LaserAnika Bernis Stecher, MD [facility]

## 2016-05-07 NOTE — Anesthesia Procedure Notes (Signed)
Epidural Patient location during procedure: OB Start time: 05/07/2016 11:20 PM End time: 05/07/2016 11:28 PM  Staffing Anesthesiologist: Yves DillARROLL, Bentleigh Stankus Performed: anesthesiologist   Preanesthetic Checklist Completed: patient identified, site marked, surgical consent, pre-op evaluation, timeout performed, IV checked, risks and benefits discussed and monitors and equipment checked  Epidural Patient position: sitting Prep: Betadine Patient monitoring: heart rate, continuous pulse ox and blood pressure Approach: midline Location: L3-L4 Injection technique: LOR air  Needle:  Needle type: Tuohy  Needle gauge: 18 G Needle length: 9 cm and 9 Catheter type: closed end flexible Catheter size: 20 Guage Test dose: negative and 1.5% lidocaine with Epi 1:200 K  Assessment Events: blood not aspirated, injection not painful, no injection resistance, negative IV test and no paresthesia  Additional Notes Time out called.  Patient placed in sitting position.  Back prepped and draped in sterile fashion.  A skin wheal was made in the L3-L4 interspace with 1% Lidocaine plain.  An 18G Tuohy needle was advanced into the epidural space by a loss of resistance technique.  The epidural catheter was threaded 3 cm with a negative test dose.  No blood or paresthesias.  The catheter was affixed to the back in sterile fashion. The patient tolerated the procedure well.Reason for block:procedure for pain

## 2016-05-08 DIAGNOSIS — Z3493 Encounter for supervision of normal pregnancy, unspecified, third trimester: Secondary | ICD-10-CM

## 2016-05-08 DIAGNOSIS — Z302 Encounter for sterilization: Secondary | ICD-10-CM

## 2016-05-08 MED ORDER — ONDANSETRON HCL 4 MG PO TABS: 4.0000 mg | ORAL_TABLET | ORAL | Status: DC | PRN

## 2016-05-08 MED ORDER — ACETAMINOPHEN 325 MG PO TABS: 650.0000 mg | ORAL_TABLET | ORAL | Status: DC | PRN

## 2016-05-08 MED ORDER — PRENATAL MULTIVITAMIN CH
1.0000 | ORAL_TABLET | Freq: Every day | ORAL | Status: DC
  Filled 2016-05-08: qty 1

## 2016-05-08 MED ORDER — LACTATED RINGERS IV SOLN: 500.0000 mL | Freq: Once | INTRAVENOUS | Status: DC

## 2016-05-08 MED ORDER — BENZOCAINE-MENTHOL 20-0.5 % EX AERO: 1.0000 "application " | INHALATION_SPRAY | CUTANEOUS | Status: DC | PRN

## 2016-05-08 MED ORDER — COCONUT OIL OIL: 1.0000 "application " | TOPICAL_OIL | Status: DC | PRN

## 2016-05-08 MED ORDER — DIPHENHYDRAMINE HCL 25 MG PO CAPS: 25.0000 mg | ORAL_CAPSULE | Freq: Four times a day (QID) | ORAL | Status: DC | PRN

## 2016-05-08 MED ORDER — IBUPROFEN 600 MG PO TABS
ORAL_TABLET | ORAL | Status: AC
  Filled 2016-05-08: qty 1

## 2016-05-08 MED ORDER — DIBUCAINE 1 % RE OINT
1.0000 | TOPICAL_OINTMENT | RECTAL | Status: DC | PRN
Start: 2016-05-08 — End: 2016-05-09

## 2016-05-08 MED ORDER — FAMOTIDINE 20 MG PO TABS
40.0000 mg | ORAL_TABLET | Freq: Once | ORAL | Status: DC
Start: 2016-05-08 — End: 2016-05-09

## 2016-05-08 MED ORDER — LACTATED RINGERS IV SOLN
INTRAVENOUS | Status: DC
  Administered 2016-05-08: via INTRAVENOUS

## 2016-05-08 MED ORDER — OXYCODONE-ACETAMINOPHEN 5-325 MG PO TABS
ORAL_TABLET | ORAL | Status: AC
  Administered 2016-05-08: 2
  Filled 2016-05-08: qty 2

## 2016-05-08 MED ORDER — SENNOSIDES-DOCUSATE SODIUM 8.6-50 MG PO TABS
2.0000 | ORAL_TABLET | ORAL | Status: DC
  Administered 2016-05-08: 2 via ORAL
  Filled 2016-05-08: qty 2

## 2016-05-08 MED ORDER — DIPHENHYDRAMINE HCL 50 MG/ML IJ SOLN: 12.5000 mg | INTRAMUSCULAR | Status: DC | PRN

## 2016-05-08 MED ORDER — IBUPROFEN 600 MG PO TABS
600.0000 mg | ORAL_TABLET | Freq: Four times a day (QID) | ORAL | Status: DC
  Administered 2016-05-08 (×4): 600 mg via ORAL
  Filled 2016-05-08 (×4): qty 1

## 2016-05-08 MED ORDER — EPHEDRINE 5 MG/ML INJ
10.0000 mg | INTRAVENOUS | Status: DC | PRN
  Filled 2016-05-08: qty 2

## 2016-05-08 MED ORDER — ZOLPIDEM TARTRATE 5 MG PO TABS: 5.0000 mg | ORAL_TABLET | Freq: Every evening | ORAL | Status: DC | PRN

## 2016-05-08 MED ORDER — METOCLOPRAMIDE HCL 10 MG PO TABS
10.0000 mg | ORAL_TABLET | Freq: Once | ORAL | Status: DC
Start: 2016-05-08 — End: 2016-05-09

## 2016-05-08 MED ORDER — PHENYLEPHRINE 40 MCG/ML (10ML) SYRINGE FOR IV PUSH (FOR BLOOD PRESSURE SUPPORT)
80.0000 ug | PREFILLED_SYRINGE | INTRAVENOUS | Status: DC | PRN
  Filled 2016-05-08: qty 5

## 2016-05-08 MED ORDER — WITCH HAZEL-GLYCERIN EX PADS: 1.0000 "application " | MEDICATED_PAD | CUTANEOUS | Status: DC | PRN

## 2016-05-08 MED ORDER — FENTANYL 2.5 MCG/ML W/ROPIVACAINE 0.2% IN NS 100 ML EPIDURAL INFUSION (ARMC-ANES): 10.0000 mL/h | EPIDURAL | Status: DC

## 2016-05-08 MED ORDER — ONDANSETRON HCL 4 MG/2ML IJ SOLN
4.0000 mg | INTRAMUSCULAR | Status: DC | PRN
  Administered 2016-05-09: 4 mg via INTRAVENOUS

## 2016-05-08 MED ORDER — SIMETHICONE 80 MG PO CHEW: 80.0000 mg | CHEWABLE_TABLET | ORAL | Status: DC | PRN

## 2016-05-08 NOTE — Anesthesia Postprocedure Evaluation (Signed)
Anesthesia Post Note  Patient: Krista PlumberHeather I Rich  Procedure(s) Performed: * No procedures listed *  Patient location during evaluation: Mother Baby Anesthesia Type: Epidural Level of consciousness: awake and alert Pain management: pain level controlled Vital Signs Assessment: post-procedure vital signs reviewed and stable Respiratory status: spontaneous breathing, nonlabored ventilation and respiratory function stable Cardiovascular status: stable Postop Assessment: no headache, no backache and epidural receding Anesthetic complications: no     Last Vitals:  Vitals:   05/08/16 0435 05/08/16 0728  BP: (!) 114/48 (!) 108/50  Pulse: 65 63  Resp: 18 20  Temp: 36.9 C 36.7 C    Last Pain:  Vitals:   05/08/16 0728  TempSrc: Oral  PainSc:                  Starling Mannsurtis,  Edyth Glomb A

## 2016-05-09 ENCOUNTER — Inpatient Hospital Stay: Payer: Medicaid Other | Admitting: Anesthesiology

## 2016-05-09 ENCOUNTER — Encounter
Admission: EM | Disposition: A | Discharge status: Home / Self Care | Payer: Self-pay | Source: Home / Self Care | Attending: Obstetrics and Gynecology

## 2016-05-09 ENCOUNTER — Encounter: Payer: Self-pay | Admitting: *Deleted

## 2016-05-09 HISTORY — PX: TUBAL LIGATION: SHX77

## 2016-05-09 LAB — CBC
HEMATOCRIT: 26.1 % — AB (ref 35.0–47.0)
HEMOGLOBIN: 8.7 g/dL — AB (ref 12.0–16.0)
MCH: 22.7 pg — AB (ref 26.0–34.0)
MCHC: 33.3 g/dL (ref 32.0–36.0)
MCV: 68.2 fL — ABNORMAL LOW (ref 80.0–100.0)
Platelets: 239 10*3/uL (ref 150–440)
RBC: 3.83 MIL/uL (ref 3.80–5.20)
RDW: 13.6 % (ref 11.5–14.5)
WBC: 10.4 10*3/uL (ref 3.6–11.0)

## 2016-05-09 LAB — GC/CHLAMYDIA PROBE AMP
CHLAMYDIA, DNA PROBE: NEGATIVE
NEISSERIA GONORRHOEAE BY PCR: NEGATIVE

## 2016-05-09 LAB — CULTURE, BETA STREP (GROUP B ONLY)

## 2016-05-09 LAB — RPR: RPR: NONREACTIVE

## 2016-05-09 SURGERY — LIGATION, FALLOPIAN TUBE, BILATERAL
Anesthesia: Spinal | Laterality: Bilateral | Wound class: Clean

## 2016-05-09 MED ORDER — FENTANYL CITRATE (PF) 100 MCG/2ML IJ SOLN
25.0000 ug | INTRAMUSCULAR | Status: AC | PRN
  Administered 2016-05-09 (×6): 25 ug via INTRAVENOUS

## 2016-05-09 MED ORDER — DEXAMETHASONE SODIUM PHOSPHATE 10 MG/ML IJ SOLN
INTRAMUSCULAR | Status: AC
  Filled 2016-05-09: qty 1

## 2016-05-09 MED ORDER — SUGAMMADEX SODIUM 500 MG/5ML IV SOLN
INTRAVENOUS | Status: DC | PRN
Start: 2016-05-09 — End: 2016-05-09
  Administered 2016-05-09: 150 mg via INTRAVENOUS

## 2016-05-09 MED ORDER — MIDAZOLAM HCL 2 MG/2ML IJ SOLN
INTRAMUSCULAR | Status: AC
  Filled 2016-05-09: qty 2

## 2016-05-09 MED ORDER — MIDAZOLAM HCL 2 MG/2ML IJ SOLN
INTRAMUSCULAR | Status: DC | PRN
  Administered 2016-05-09: 2 mg via INTRAVENOUS

## 2016-05-09 MED ORDER — FENTANYL CITRATE (PF) 100 MCG/2ML IJ SOLN
INTRAMUSCULAR | Status: AC
  Administered 2016-05-09: 25 ug via INTRAVENOUS
  Filled 2016-05-09: qty 2

## 2016-05-09 MED ORDER — DEXAMETHASONE SODIUM PHOSPHATE 10 MG/ML IJ SOLN
INTRAMUSCULAR | Status: DC | PRN
  Administered 2016-05-09: 8 mg via INTRAVENOUS

## 2016-05-09 MED ORDER — FENTANYL CITRATE (PF) 100 MCG/2ML IJ SOLN
INTRAMUSCULAR | Status: AC
  Filled 2016-05-09: qty 2

## 2016-05-09 MED ORDER — DOCUSATE SODIUM 100 MG PO CAPS: 100.0000 mg | ORAL_CAPSULE | Freq: Two times a day (BID) | ORAL | 2 refills | Status: DC | PRN

## 2016-05-09 MED ORDER — LIDOCAINE HCL (PF) 2 % IJ SOLN
INTRAMUSCULAR | Status: AC
  Filled 2016-05-09: qty 2

## 2016-05-09 MED ORDER — OXYCODONE-ACETAMINOPHEN 5-325 MG PO TABS: 1.0000 | ORAL_TABLET | Freq: Four times a day (QID) | ORAL | 0 refills | Status: DC | PRN

## 2016-05-09 MED ORDER — FENTANYL CITRATE (PF) 100 MCG/2ML IJ SOLN
INTRAMUSCULAR | Status: DC | PRN
  Administered 2016-05-09: 50 ug via INTRAVENOUS

## 2016-05-09 MED ORDER — ONDANSETRON HCL 4 MG/2ML IJ SOLN
INTRAMUSCULAR | Status: AC
Start: 2016-05-09 — End: 2016-05-09
  Filled 2016-05-09: qty 2

## 2016-05-09 MED ORDER — ROCURONIUM BROMIDE 50 MG/5ML IV SOLN
INTRAVENOUS | Status: AC
  Filled 2016-05-09: qty 1

## 2016-05-09 MED ORDER — ONDANSETRON HCL 4 MG/2ML IJ SOLN
4.0000 mg | Freq: Once | INTRAMUSCULAR | Status: AC | PRN
  Administered 2016-05-09: 4 mg via INTRAVENOUS

## 2016-05-09 MED ORDER — ONDANSETRON HCL 4 MG/2ML IJ SOLN
INTRAMUSCULAR | Status: AC
  Administered 2016-05-09: 4 mg via INTRAVENOUS
  Filled 2016-05-09: qty 2

## 2016-05-09 MED ORDER — SUGAMMADEX SODIUM 200 MG/2ML IV SOLN
INTRAVENOUS | Status: AC
  Filled 2016-05-09: qty 2

## 2016-05-09 MED ORDER — ROCURONIUM BROMIDE 100 MG/10ML IV SOLN
INTRAVENOUS | Status: DC | PRN
  Administered 2016-05-09: 30 mg via INTRAVENOUS

## 2016-05-09 MED ORDER — PROPOFOL 10 MG/ML IV BOLUS
INTRAVENOUS | Status: DC | PRN
  Administered 2016-05-09: 120 mg via INTRAVENOUS

## 2016-05-09 MED ORDER — FERROUS SULFATE 325 (65 FE) MG PO TABS: 325.0000 mg | ORAL_TABLET | Freq: Two times a day (BID) | ORAL | 1 refills | Status: DC

## 2016-05-09 MED ORDER — LIDOCAINE HCL (CARDIAC) 20 MG/ML IV SOLN
INTRAVENOUS | Status: DC | PRN
Start: 2016-05-09 — End: 2016-05-09
  Administered 2016-05-09: 60 mg via INTRAVENOUS

## 2016-05-09 MED ORDER — OXYCODONE-ACETAMINOPHEN 5-325 MG PO TABS
1.0000 | ORAL_TABLET | ORAL | Status: DC | PRN
  Administered 2016-05-09: 1 via ORAL
  Administered 2016-05-09: 2 via ORAL
  Administered 2016-05-09: 1 via ORAL
  Filled 2016-05-09: qty 1
  Filled 2016-05-09: qty 2
  Filled 2016-05-09: qty 1

## 2016-05-09 MED ORDER — PROPOFOL 10 MG/ML IV BOLUS
INTRAVENOUS | Status: AC
  Filled 2016-05-09: qty 20

## 2016-05-09 MED ORDER — BUPIVACAINE HCL (PF) 0.5 % IJ SOLN
INTRAMUSCULAR | Status: DC | PRN
  Administered 2016-05-09 (×2): 5 mL

## 2016-05-09 MED ORDER — BUPIVACAINE HCL (PF) 0.5 % IJ SOLN
INTRAMUSCULAR | Status: AC
  Filled 2016-05-09: qty 30

## 2016-05-09 MED ORDER — IBUPROFEN 600 MG PO TABS: 600.0000 mg | ORAL_TABLET | Freq: Four times a day (QID) | ORAL | 1 refills | Status: DC | PRN

## 2016-05-09 SURGICAL SUPPLY — 27 items
BLADE SURG SZ11 CARB STEEL (BLADE) ×3 IMPLANT
CHLORAPREP W/TINT 26ML (MISCELLANEOUS) ×3 IMPLANT
DERMABOND ADVANCED (GAUZE/BANDAGES/DRESSINGS) ×2
DERMABOND ADVANCED .7 DNX12 (GAUZE/BANDAGES/DRESSINGS) ×1 IMPLANT
DRAPE LAPAROTOMY 100X77 ABD (DRAPES) ×3 IMPLANT
DRSG TEGADERM 2-3/8X2-3/4 SM (GAUZE/BANDAGES/DRESSINGS) ×3 IMPLANT
GAUZE SPONGE NON-WVN 2X2 STRL (MISCELLANEOUS) ×1 IMPLANT
GLOVE BIO SURGEON STRL SZ 6.5 (GLOVE) ×2 IMPLANT
GLOVE BIO SURGEONS STRL SZ 6.5 (GLOVE) ×1
GLOVE INDICATOR 7.0 STRL GRN (GLOVE) ×9 IMPLANT
GOWN STRL REUS W/ TWL LRG LVL3 (GOWN DISPOSABLE) ×2 IMPLANT
GOWN STRL REUS W/TWL LRG LVL3 (GOWN DISPOSABLE) ×4
KIT RM TURNOVER CYSTO AR (KITS) ×3 IMPLANT
LABEL OR SOLS (LABEL) ×3 IMPLANT
LIQUID BAND (GAUZE/BANDAGES/DRESSINGS) IMPLANT
NEEDLE HYPO 25GX1X1/2 BEV (NEEDLE) ×3 IMPLANT
NS IRRIG 500ML POUR BTL (IV SOLUTION) ×3 IMPLANT
PACK BASIN MINOR ARMC (MISCELLANEOUS) ×3 IMPLANT
SPONGE VERSALON 2X2 STRL (MISCELLANEOUS) ×2
SUT MNCRL 4-0 (SUTURE) ×2
SUT MNCRL 4-0 27XMFL (SUTURE) ×1
SUT PLAIN GUT 0 (SUTURE) ×6 IMPLANT
SUT VIC AB 0 CT1 36 (SUTURE) ×3 IMPLANT
SUT VIC AB 0 SH 27 (SUTURE) ×3 IMPLANT
SUT VICRYL 0 AB UR-6 (SUTURE) ×6 IMPLANT
SUTURE MNCRL 4-0 27XMF (SUTURE) ×1 IMPLANT
SYRINGE 10CC LL (SYRINGE) ×3 IMPLANT

## 2016-05-09 NOTE — Discharge Summary (Signed)
Obstetric Discharge Summary Reason for Admission: rupture of membranes at 2976w0d Prenatal Procedures: ultrasound Intrapartum Procedures: spontaneous vaginal delivery Postpartum Procedures: P.P. tubal ligation Complications-Operative and Postpartum: none   Hemoglobin  Date Value Ref Range Status  05/09/2016 8.7 (L) 12.0 - 16.0 g/dL Final   HCT  Date Value Ref Range Status  05/09/2016 26.1 (L) 35.0 - 47.0 % Final   Hematocrit  Date Value Ref Range Status  02/21/2016 29.7 (L) 34.0 - 46.6 % Final    Physical Exam:  Vitals:   05/09/16 1126 05/09/16 1141 05/09/16 1223 05/09/16 1552  BP: 111/65 109/69 114/61 125/66  Pulse: 69 66 66 84  Resp: 10 11 17 16   Temp:  98.7 F (37.1 C) 97.6 F (36.4 C) 97.7 F (36.5 C)  TempSrc:   Oral Oral  SpO2: 99% 99% 96% 98%  Weight:      Height:       General: alert and no distress Lochia: appropriate Uterine Fundus: firm Incision: healing well, no significant drainage, no dehiscence, no significant erythema DVT Evaluation: No evidence of DVT seen on physical exam. Negative Homan's sign. No cords or calf tenderness. No significant calf/ankle edema.  Discharge Diagnoses: Term Pregnancy-delivered and anemia of pregnancy  Discharge Information: Date: 05/09/2016 Activity: pelvic rest Diet: routine Medications: PNV, Ibuprofen, Colace, Iron and Percocet Condition: stable Instructions: refer to practice specific booklet Discharge to: home Follow-up Information    Krista LaserAnika Hania Rich, [name] Schedule an appointment as soon as possible for a visit in 6 week(s).   Specialties:  Obstetrics and Gynecology, Radiology Why:  Please call to make your 6 week postpartum follow up appointment Contact information: [address] [place] [address] [phone]           Newborn Data: Live born female  Birth Weight: 6 lb 6.7 oz (2910 g) APGAR: 8, 9  Home with mother.  Krista Lasernika Krista Rich 05/09/2016, 5:09 PM

## 2016-05-09 NOTE — Op Note (Signed)
Procedure(s): BILATERAL TUBAL LIGATION Procedure Note  Krista Rich female 26 y.o. 05/09/2016  Indications: The patient is a 26 y.o. [phone] female who is PPD#1, desiring permanent sterilization  Pre-operative Diagnosis: Multiparity, desires permanent sterilization, moderate anemia of pregnancy.   Post-operative Diagnosis: Same  Surgeon: Hildred LaserAnika Jinger Middlesworth, MD  Assistants:  None  Anesthesia: General endotracheal anesthesia  Procedure Details: The patient was seen in the Holding Room. The risks, benefits, complications, treatment options, and expected outcomes were discussed with the patient.  The patient concurred with the proposed plan, giving informed consent.  The site of surgery properly noted/marked. The patient was taken to the Operating Room, identified as Krista PlumberHeather I Rich and the procedure verified as Procedure(s) (LRB): BILATERAL TUBAL LIGATION (Bilateral).   She was then placed under general anesthesia without difficulty. She was placed in the dorsal lithotomy position, and was prepped and draped in a sterile manner.  A Time Out was held and the above information confirmed. Attention was turned to the patient's abdomen where a small transverse skin incision was made under the umbilical fold. Prior to incision, the area was injected with 5 cc of Sensorcaine 0.5%. The incision was taken down to the layer of fascia using the scalpel, and fascia was incised, and extended bilaterally using Mayo scissors. The peritoneum was entered in a sharp fashion. Attention was then turned to the patient's uterus, and left fallopian tube was identified and followed out to the fimbriated end.  The Babcock clamp was then used to grasp the tube approximately 4 cm from the cornual region.  A 3 cm segment of tube was then ligated with a free tie of 0-Chromic using the Parkland method and excised.  The right fallopian tube was then ligated in a similar fashion and excised. The tubal lumens were cauterized bilaterally.   Good hemostasis was noted with bilateral fallopian tubes.  The instruments were then removed from the patient's abdomen and the fascial incision was repaired with 0 Vicryl, and injected with 5 cc of Sensorcaine 0.5%.  The skin was closed with a 4-0 Vicryl subcuticular stitch. The patient tolerated the procedure well.  Instrument, sponge, and needle counts were correct times two.  The patient was then taken to the recovery room awake and in stable condition.   Findings: Uterine outline, fallopian tubes and ovaries appeared normal.   Estimated Blood Loss:        Drains: None         Total IV Fluids:  600 ml of lactated ringer's solution  Specimens: Bilateral segments of left and right fallopian tubes.          Implants: None         Complications:  None; patient tolerated the procedure well.         Disposition: PACU - hemodynamically stable.         Condition: stable   Hildred LaserAnika Namiyah Grantham, MD [facility]

## 2016-05-09 NOTE — Discharge Instructions (Signed)
Please call your doctor or return to the ER if you experience any chest pains, shortness of breath, fever greater than 101, any heavy bleeding (saturating more than 1 pad per hour), large clots, or foul smelling discharge, any worsening abdominal pain and cramping that is not controlled by pain medication, or any signs of postpartum depression. No tampons, enemas, douches, or sexual intercourse for 6 weeks. Also avoid tub baths, hot tubs, or swimming for 6 weeks.   General Postpartum Discharge Instructions  Do not drink alcohol or take tranquilizers.  Do not take medicine that has not been prescribed by your doctor.  Take showers instead of baths until your doctor gives you permission to take baths.  No sexual intercourse or placement of anything in the vagina for 6 weeks or as instructed by your doctor. Only take prescription or over-the-counter medicines  for pain, discomfort, or fever as directed by your doctor. Take medicines (antibiotics) that kill germs if they are prescribed for you.   Call the office or go to the Emergency Room if:  You feel sick to your stomach (nauseous).  You start to throw up (vomit).  You have trouble eating or drinking.  You have an oral temperature above 101.  You have constipation that is not helped by adjusting diet or increasing fluid intake. Pain medicines are a common cause of constipation.  You have foul smelling vaginal discharge or odor.  You have bleeding requiring changing more than 1 pad per hour. You have any other concerns.  SEEK IMMEDIATE MEDICAL CARE IF:  You have persistent dizziness.  You have difficulty breathing or shortness of breath.  You have an oral temperature above 102.5, not controlled by medicine.

## 2016-05-09 NOTE — Progress Notes (Signed)
OBSTETRICS AND GYNECOLOGY PRE-OPERATIVE NOTE   Pre-Op Diagnosis: Multiparity, desiring permanent sterilization, anemia of pregnancy  Planned Procedure: Postpartum tubal ligation  Surgeons: Hildred LaserAnika Averey Koning, MD  Anesthesia: General  Blood: Type and screened  Labs:  CBC Latest Ref Rng & Units 05/09/2016 05/07/2016 02/21/2016  WBC 3.6 - 11.0 K/uL 10.4 8.8 -  Hemoglobin 12.0 - 16.0 g/dL 8.6(V8.7(L) 7.8(I8.6(L) -  Hematocrit 35.0 - 47.0 % 26.1(L) 26.3(L) 29.7(L)  Platelets 150 - 440 K/uL 239 236 -    Lab Results  Component Value Date   ABORH O POS 05/07/2016    Antibiotics: None Needed   Consent: Signed and on chart   Pre-Op BTL Consent Consent: Surgical consent and tubal sterilization. Alternatives to sterilizations are documented on the surgical consent that has been signed by the patient. Patient confirms that she has been counseled about permanent sterilization on mutiple occasions during her antepartum course and during this admission. She understands the alternatives to permanents include: oral contraceptive pills, depot provera, patch, ring, intrauterine device (5 year or 10 year), Implanon, condoms and cervical caps/diaphram.  She states that she desires the procedure.  To OR when ready.    Hildred LaserAnika Weiland Tomich, MD [facility]

## 2016-05-09 NOTE — Anesthesia Post-op Follow-up Note (Cosign Needed)
Anesthesia QCDR form completed.        

## 2016-05-09 NOTE — Transfer of Care (Signed)
Immediate Anesthesia Transfer of Care Note  Patient: Krista Rich  Procedure(s) Performed: Procedure(s): BILATERAL TUBAL LIGATION (Bilateral)  Patient Location: PACU  Anesthesia Type:General  Level of Consciousness: sedated  Airway & Oxygen Therapy: Patient Spontanous Breathing and Patient connected to face mask oxygen  Post-op Assessment: Report given to RN and Post -op Vital signs reviewed and stable  Post vital signs: Reviewed and stable  Last Vitals:  Vitals:   05/09/16 0922 05/09/16 1041  BP: 123/69 115/73  Pulse: 62 91  Resp: 18 12  Temp: 37.3 C 36.6 C    Last Pain:  Vitals:   05/09/16 1041  TempSrc:   PainSc: Asleep         Complications: No apparent anesthesia complications

## 2016-05-09 NOTE — Progress Notes (Signed)
Discharge order received from doctor. Reviewed discharge instructions and prescriptions with patient and answered all questions. Follow up appointment instructions given. Patient verbalized understanding. ID bands checked. Patient discharged home with infant via wheelchair by nursing/auxillary.    Mesha Schamberger Garner, RN  

## 2016-05-09 NOTE — Progress Notes (Addendum)
Post Partum Day # 1, s/p SVD  Subjective: no complaints, up ad lib, voiding and tolerating PO  Objective: Temp:  [97.7 F (36.5 C)-99.7 F (37.6 C)] 97.7 F (36.5 C) (02/02 0807) Pulse Rate:  [64-72] 64 (02/02 0807) Resp:  [17-20] 17 (02/02 0807) BP: (110-131)/(53-66) 119/66 (02/02 0807) SpO2:  [97 %-100 %] 100 % (02/02 16100807)  Physical Exam:  General: alert and no distress  Lungs: clear to auscultation bilaterally Breasts: normal appearance, no masses or tenderness Heart: regular rate and rhythm, S1, S2 normal, no murmur, click, rub or gallop Pelvis: Lochia: appropriate, Uterine Fundus: firm Extremities: DVT Evaluation: No evidence of DVT seen on physical exam. Negative Homan's sign. No cords or calf tenderness. No significant calf/ankle edema.   Recent Labs  05/07/16 1837 05/09/16 0513  HGB 8.6* 8.7*  HCT 26.3* 26.1*    Assessment/Plan: Circumcision prior to discharge and Contraception BTL, scheduled for this morning.  Patient has remained NPO since midnight.   Bottle feeding Moderate anemia of pregnancy. Asymptomatic. Will treat with PO iron supplementation. Dispo: possible d/c home later this evening, or in a.m.    LOS: 2 days   Hildred LaserAnika Aalaya Yadao, MD [facility]

## 2016-05-09 NOTE — Anesthesia Procedure Notes (Signed)
Procedure Name: Intubation Date/Time: 05/09/2016 9:45 AM Performed by: Allean Found Pre-anesthesia Checklist: Patient identified, Emergency Drugs available, Suction available, Patient being monitored and Timeout performed Patient Re-evaluated:Patient Re-evaluated prior to inductionOxygen Delivery Method: Circle system utilized Preoxygenation: Pre-oxygenation with 100% oxygen Intubation Type: IV induction and Rapid sequence Laryngoscope Size: Mac and 3 Tube type: Oral Tube size: 7.0 mm Number of attempts: 1 Airway Equipment and Method: Stylet Placement Confirmation: ETT inserted through vocal cords under direct vision,  positive ETCO2 and breath sounds checked- equal and bilateral Secured at: 22 cm Tube secured with: Tape Dental Injury: Teeth and Oropharynx as per pre-operative assessment

## 2016-05-11 LAB — CULTURE, BETA STREP (GROUP B ONLY): STREP GP B CULTURE: NEGATIVE

## 2016-05-12 LAB — SURGICAL PATHOLOGY

## 2016-05-14 ENCOUNTER — Encounter: Payer: Self-pay | Admitting: Obstetrics and Gynecology

## 2016-05-14 NOTE — Anesthesia Postprocedure Evaluation (Signed)
Anesthesia Post Note  Patient: Ranae PlumberHeather I Towe  Procedure(s) Performed: Procedure(s) (LRB): BILATERAL TUBAL LIGATION (Bilateral)  Patient location during evaluation: PACU Anesthesia Type: General Level of consciousness: awake and alert Pain management: pain level controlled Vital Signs Assessment: post-procedure vital signs reviewed and stable Respiratory status: spontaneous breathing, nonlabored ventilation, respiratory function stable and patient connected to nasal cannula oxygen Cardiovascular status: blood pressure returned to baseline and stable Postop Assessment: no signs of nausea or vomiting Anesthetic complications: no     Last Vitals:  Vitals:   05/09/16 1223 05/09/16 1552  BP: 114/61 125/66  Pulse: 66 84  Resp: 17 16  Temp: 36.4 C 36.5 C    Last Pain:  Vitals:   05/09/16 1552  TempSrc: Oral  PainSc:                  Yevette EdwardsJames G Adams

## 2016-05-14 NOTE — Anesthesia Preprocedure Evaluation (Signed)
Anesthesia Evaluation  Patient identified by MRN, date of birth, ID band Patient awake    Reviewed: Allergy & Precautions, H&P , NPO status , Patient's Chart, lab work & pertinent test results, reviewed documented beta blocker date and time   Airway Mallampati: II  TM Distance: >3 FB Neck ROM: full    Dental  (+) Teeth Intact   Pulmonary neg pulmonary ROS, Current Smoker,    Pulmonary exam normal        Cardiovascular negative cardio ROS Normal cardiovascular exam Rhythm:regular Rate:Normal     Neuro/Psych negative neurological ROS  negative psych ROS   GI/Hepatic negative GI ROS, Neg liver ROS,   Endo/Other  negative endocrine ROS  Renal/GU negative Renal ROS  negative genitourinary   Musculoskeletal   Abdominal   Peds  Hematology negative hematology ROS (+) anemia ,   Anesthesia Other Findings Past Medical History: No date: Medical history non-contributory Past Surgical History: No date: NO PAST SURGERIES 05/09/2016: TUBAL LIGATION Bilateral     Comment: Procedure: BILATERAL TUBAL LIGATION;  Surgeon:              Hildred LaserAnika Cherry, MD;  Location: [facility] ORS;                Service: Gynecology;  Laterality: Bilateral; BMI    Body Mass Index:  24.96 kg/m     Reproductive/Obstetrics negative OB ROS                             Anesthesia Physical Anesthesia Plan  ASA: II  Anesthesia Plan: General ETT   Post-op Pain Management:    Induction:   Airway Management Planned:   Additional Equipment:   Intra-op Plan:   Post-operative Plan:   Informed Consent: I have reviewed the patients History and Physical, chart, labs and discussed the procedure including the risks, benefits and alternatives for the proposed anesthesia with the patient or authorized representative who has indicated his/her understanding and acceptance.   Dental Advisory Given  Plan Discussed with:  CRNA  Anesthesia Plan Comments:         Anesthesia Quick Evaluation

## 2016-06-13 NOTE — Progress Notes (Signed)
   OBSTETRICS POSTPARTUM CLINIC PROGRESS NOTE  Subjective:     Krista Rich is a 26 y.o. [phone] female who presents for a postpartum visit. She is 6 weeks postpartum following a vaginal delivery. I have fully reviewed the prenatal and intrapartum course. The delivery was at 38 gestational weeks.  Anesthesia: epidural. Postpartum course has been well. Baby's course has been well. Baby is feeding by bottle - Similac Advance. Bleeding: patient has not resumed menses, with No LMP recorded.. Bowel function is normal. Bladder function is normal. Patient is not sexually active. Contraception method is tubal ligation (performed postpartum). Postpartum depression screening: negative.  The following portions of the patient's history were reviewed and updated as appropriate: allergies, current medications, past family history, past medical history, past social history, past surgical history and problem list.  Review of Systems Pertinent items noted in HPI and remainder of comprehensive ROS otherwise negative.   Objective:    BP 113/66 (BP Location: Left Arm, Patient Position: Sitting, Cuff Size: Normal)   Pulse 64   Ht 5\' 5"  (1.651 m)   Wt 126 lb 14.4 oz (57.6 kg)   Breastfeeding? No   BMI 21.12 kg/m   General:  alert and no distress   Breasts:  inspection negative, no nipple discharge or bleeding, no masses or nodularity palpable  Lungs: clear to auscultation bilaterally  Heart:  regular rate and rhythm, S1, S2 normal, no murmur, click, rub or gallop  Abdomen: soft, non-tender; bowel sounds normal; no masses,  no organomegaly.     Vulva:  normal  Vagina: normal vagina, no discharge, exudate, lesion, or erythema  Cervix:  no cervical motion tenderness and no lesions  Corpus: normal size, contour, position, consistency, mobility, non-tender  Adnexa:  normal adnexa and no mass, fullness, tenderness  Rectal Exam: Not performed.         Labs:  Lab Results  Component Value Date   HGB 8.7 (L)  05/09/2016     Assessment:   Routine postpartum exam.   Anemia of pregnancy  Plan:    1. Contraception: tubal ligation 2. Will check Hgb for h/o anemia.  3. Follow up in: 6 months for annual exam, or as needed.    Hildred LaserAnika Khizar Fiorella, MD [facility]

## 2016-06-19 ENCOUNTER — Encounter: Payer: Self-pay | Admitting: Obstetrics and Gynecology

## 2016-06-19 ENCOUNTER — Ambulatory Visit (INDEPENDENT_AMBULATORY_CARE_PROVIDER_SITE_OTHER): Payer: Medicaid Other | Admitting: Obstetrics and Gynecology

## 2016-06-19 DIAGNOSIS — Z130 Encounter for screening for diseases of the blood and blood-forming organs and certain disorders involving the immune mechanism: Secondary | ICD-10-CM

## 2016-06-20 LAB — HEMOGLOBIN AND HEMATOCRIT, BLOOD
Hematocrit: 36.3 % (ref 34.0–46.6)
Hemoglobin: 11.2 g/dL (ref 11.1–15.9)

## 2016-10-06 IMAGING — US US OB COMP LESS 14 WK
1 series · 13 of 28 positions shown · non-contrast
Comparison: None.

CLINICAL DATA: Bleeding in a pregnant patient.

EXAM:
OBSTETRIC <14 WK US
US DOPPLER ULTRASOUND OF OVARIES
TECHNIQUE: Transabdominal ultrasound examinations were performed for complete
evaluation of the gestation as well as the maternal uterus, adnexal
regions, and pelvic cul-de-sac.
Color and duplex Doppler ultrasound was utilized to evaluate blood
flow to the ovaries.

[Series 1: us ob comp less 14 wk · 0.10mm/px · 13 of 77 slices shown]
[im 3/77]
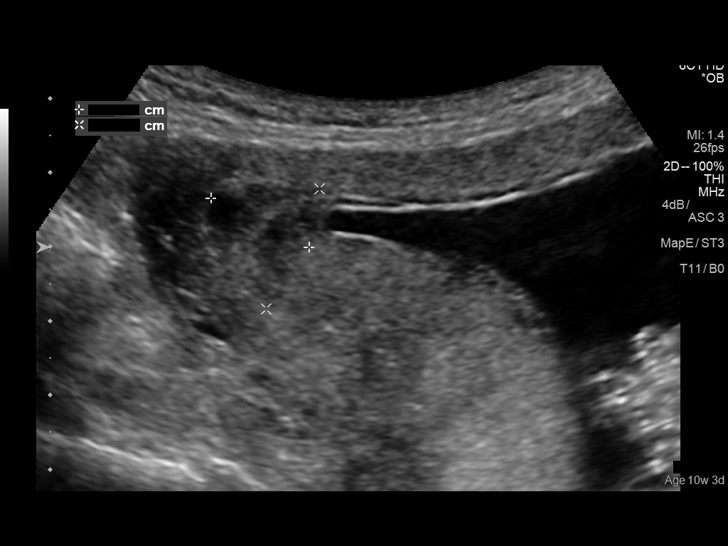
[im 9/77]
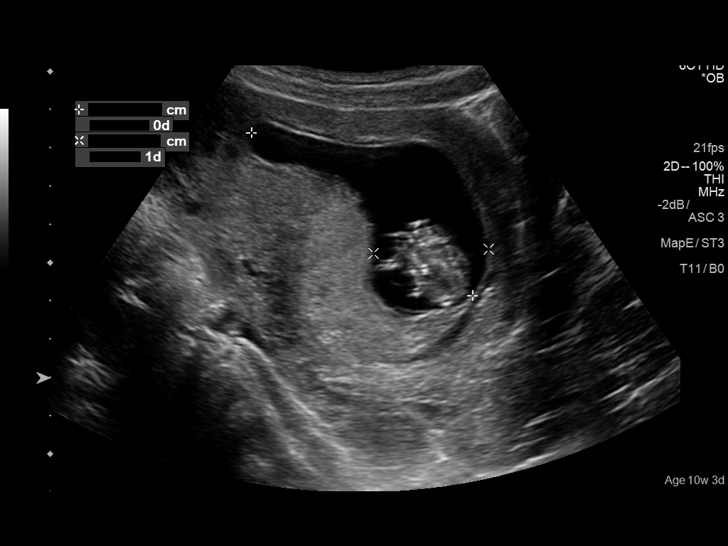
[im 15/77]
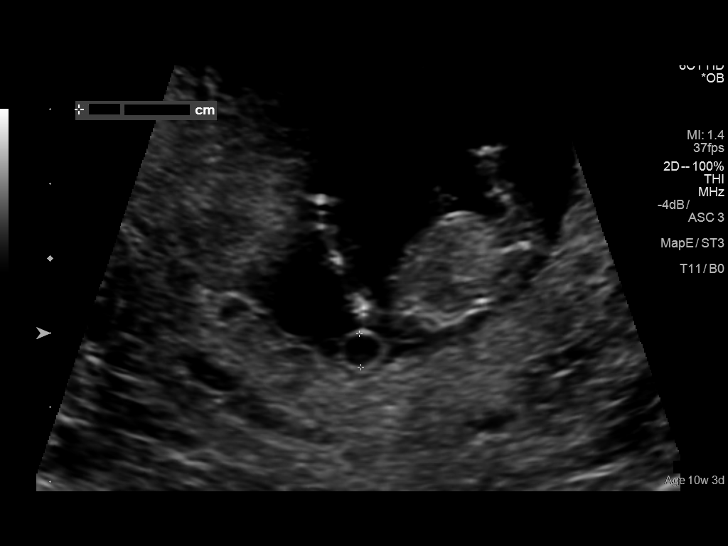
[im 20/77]
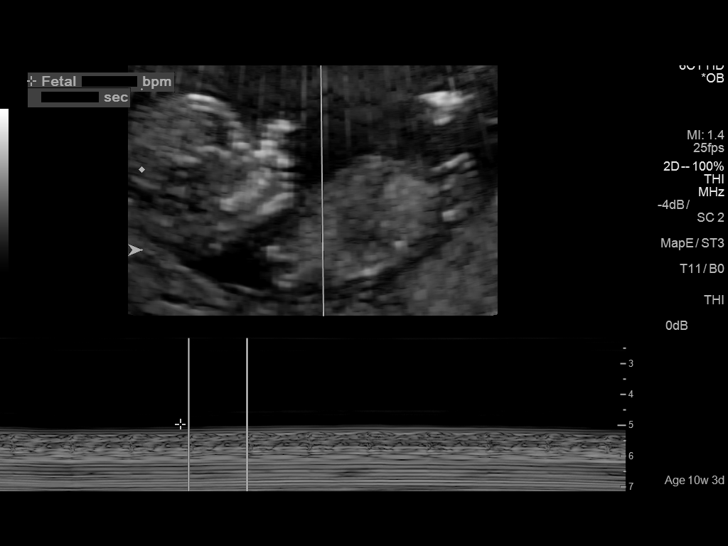
[im 26/77]
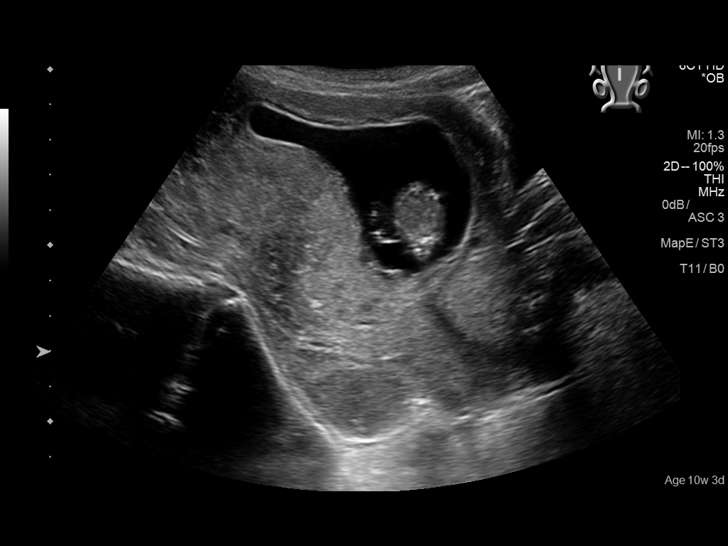
[im 31/77]
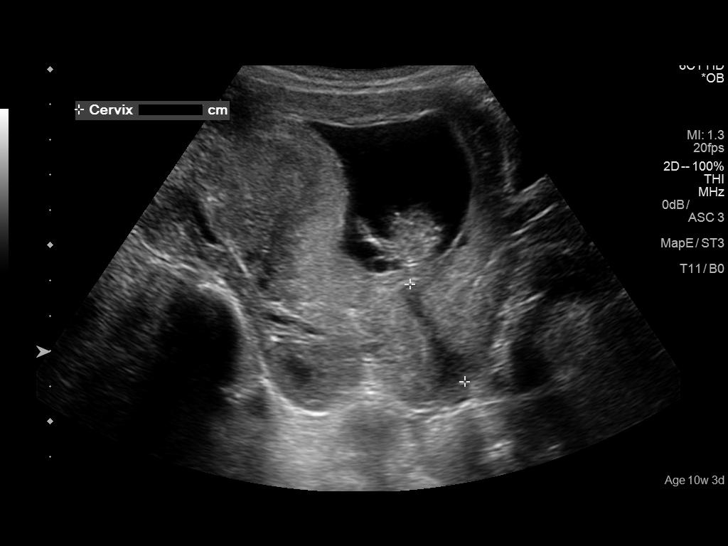
[im 40/77]
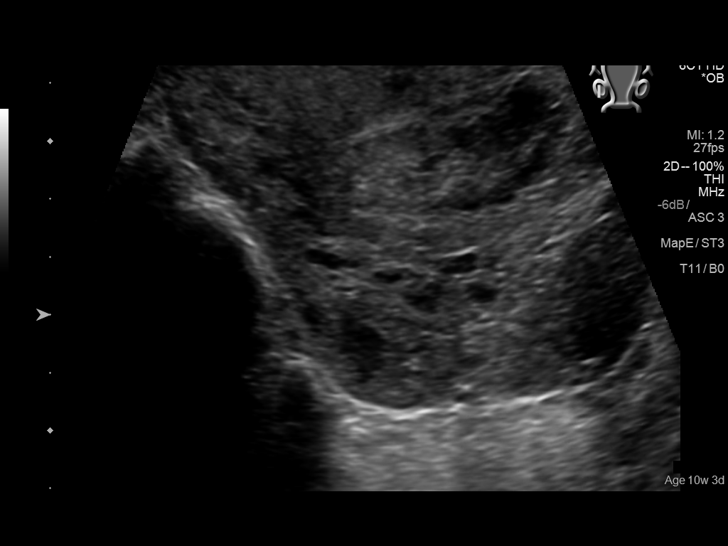
[im 46/77]
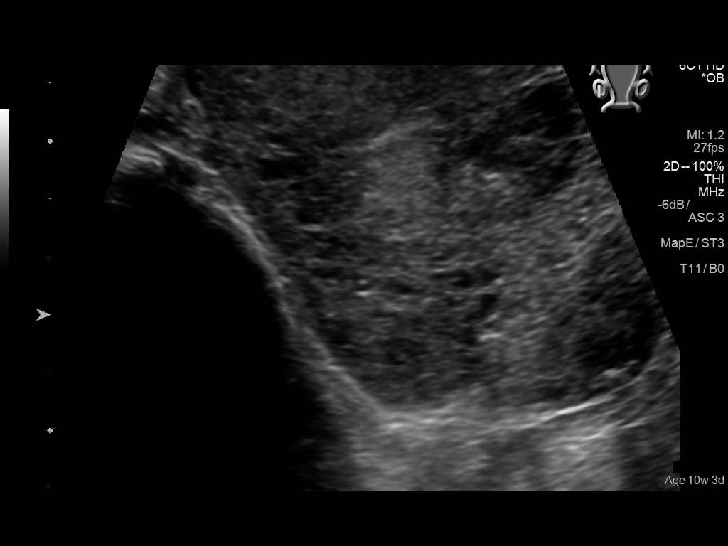
[im 51/77]
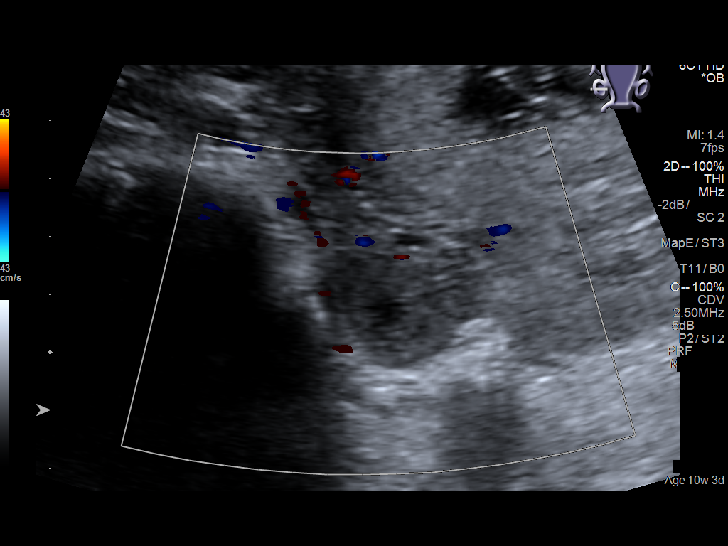
[im 57/77]
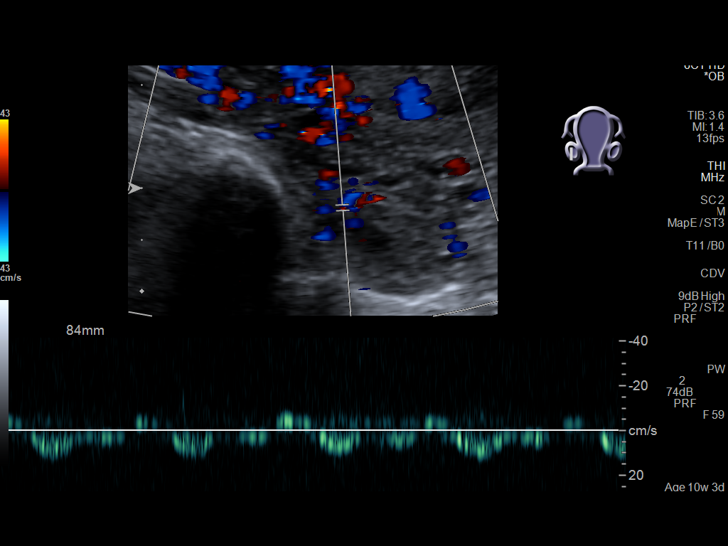
[im 62/77]
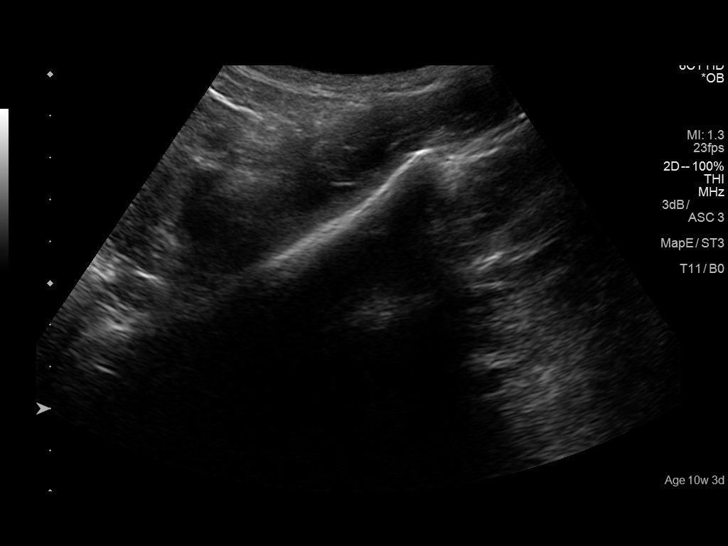
[im 68/77]
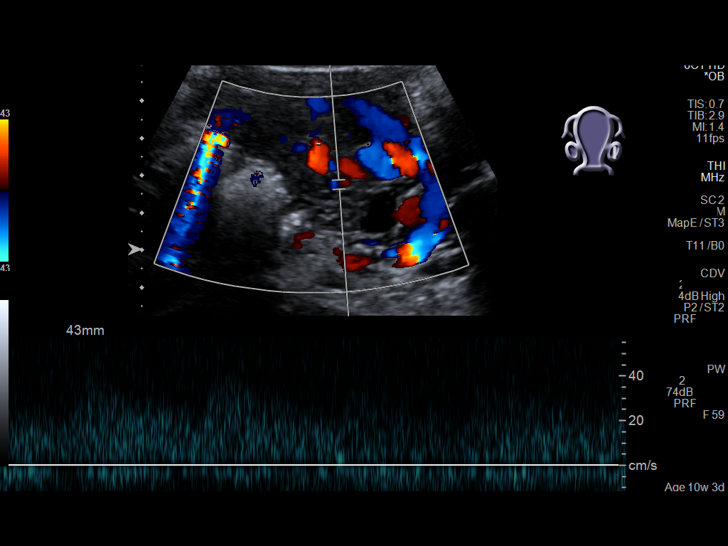
[im 74/77]
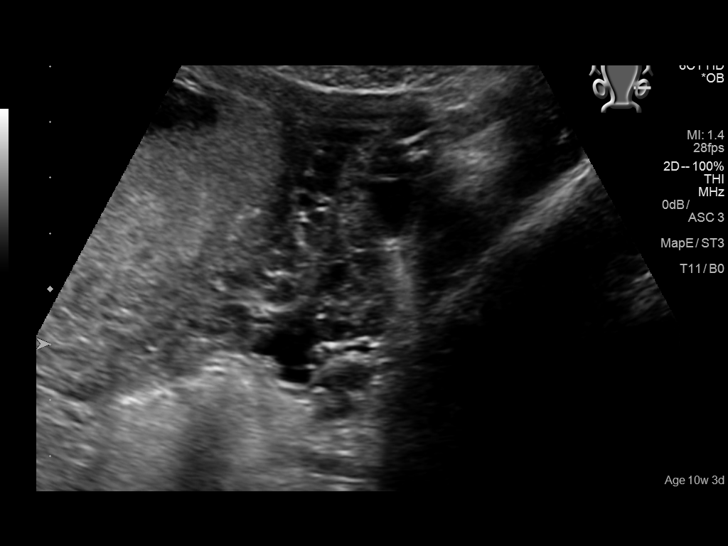

[13 of 28 positions shown; findings below may reference images not displayed]

FINDINGS: Intrauterine gestational sac: Present

Yolk sac:  Present

Embryo:  A single embryo is identified.

Cardiac Activity: Present

Heart Rate: 175 bpm

CRL:   4.77 cm 11 w 4 d                  US EDC: May 28, 2016

Subchorionic hemorrhage: 1.8 x 1.5 x 1.2 cm subchorionic hemorrhage
is identified.

Maternal uterus/adnexae: The ovaries are normal in appearance with a
corpus luteum cyst on the right.

Pulsed Doppler evaluation of both ovaries demonstrates normal
appearing low-resistance arterial and venous waveforms.

Of note, the placenta appears to overlie the 3.2 cm closed cervix.
IMPRESSION: 1. Single live IUP.
2. Small subchorionic hemorrhage.
3. The placenta overlies the 3.2 cm closed cervix. Recommend
attention to the placenta location versus the cervix on the study
for anatomic imaging.

## 2018-04-05 ENCOUNTER — Other Ambulatory Visit: Payer: Self-pay

## 2018-04-05 ENCOUNTER — Emergency Department
Admission: EM | Admit: 2018-04-05 | Discharge: 2018-04-05 | Disposition: A | Discharge status: Home / Self Care | Payer: Self-pay | Attending: Emergency Medicine | Admitting: Emergency Medicine

## 2018-04-05 ENCOUNTER — Encounter: Payer: Self-pay | Admitting: Emergency Medicine

## 2018-04-05 DIAGNOSIS — F17210 Nicotine dependence, cigarettes, uncomplicated: Secondary | ICD-10-CM | POA: Insufficient documentation

## 2018-04-05 DIAGNOSIS — J111 Influenza due to unidentified influenza virus with other respiratory manifestations: Secondary | ICD-10-CM | POA: Insufficient documentation

## 2018-04-05 DIAGNOSIS — R69 Illness, unspecified: Secondary | ICD-10-CM

## 2018-04-05 MED ORDER — BENZONATATE 200 MG PO CAPS
200.0000 mg | ORAL_CAPSULE | Freq: Three times a day (TID) | ORAL | 0 refills | Status: AC | PRN
Start: 2018-04-05 — End: ?

## 2018-04-05 NOTE — Discharge Instructions (Addendum)
Follow-up with your regular doctor if not better in 3 days.  Return emergency department if symptoms are worsening.  Drink plenty of fluids.  Take Tylenol and ibuprofen.  Tessalon Perles for the cough.

## 2018-04-05 NOTE — ED Provider Notes (Signed)
[facility] Emergency Department Provider Note  ____________________________________________   First MD Initiated Contact with Patient 04/05/18 1329     (approximate)  I have reviewed the triage vital signs and the nursing notes.   HISTORY  Chief Complaint Fever    HPI Krista Rich is a 27 y.o. female flulike symptoms, patient is complained of f body aches.  Positive cough, positive sore throat, denies vomiting, denies diarrhea; denies chest pain or sob.  Denies fever.  Sx for 4 days   Past Medical History:  Diagnosis Date  . Medical history non-contributory     Patient Active Problem List   Diagnosis Date Noted  . PROM (premature rupture of membranes) 05/07/2016  . Dyspareunia in female 02/21/2016  . Supervision of normal intrauterine pregnancy in multigravida in second trimester 01/24/2016  . Tobacco abuse 11/20/2015  . Subchorionic hematoma in first trimester, antepartum 11/20/2015    Past Surgical History:  Procedure Laterality Date  . NO PAST SURGERIES    . TUBAL LIGATION Bilateral 05/09/2016   Procedure: BILATERAL TUBAL LIGATION;  Surgeon: Hildred LaserAnika Cherry, MD;  Location: [facility] ORS;  Service: Gynecology;  Laterality: Bilateral;    Prior to Admission medications   Medication Sig Start Date End Date Taking? Authorizing Provider  benzonatate (TESSALON) 200 MG capsule Take 1 capsule (200 mg total) by mouth 3 (three) times daily as needed for cough. 04/05/18   Faythe GheeFisher, Loyalty Brashier W, PA-C    Allergies Patient has no known allergies.  Family History  Problem Relation Age of Onset  . Hyperlipidemia Mother     Social History Social History   Tobacco Use  . Smoking status: Current Some Day Smoker    Packs/day: 0.25    Types: Cigarettes  . Smokeless tobacco: Never Used  Substance Use Topics  . Alcohol use: No  . Drug use: No    Types: Marijuana    Review of Systems  Constitutional: Denies fever/chills Eyes: No visual changes. ENT: No  sore throat.  Complains of congestion Respiratory: Complains of cough Genitourinary: Negative for dysuria. Musculoskeletal: Negative for back pain. Skin: Negative for rash.    ____________________________________________   PHYSICAL EXAM:  VITAL SIGNS: ED Triage Vitals  Enc Vitals Group     BP 04/05/18 1258 118/72     Pulse Rate 04/05/18 1258 93     Resp 04/05/18 1258 20     Temp 04/05/18 1258 98.3 F (36.8 C)     Temp Source 04/05/18 1258 Oral     SpO2 04/05/18 1258 99 %     Weight 04/05/18 1258 126 lb 15.8 oz (57.6 kg)     Height --      Head Circumference --      Peak Flow --      Pain Score 04/05/18 1257 8     Pain Loc --      Pain Edu? --      Excl. in GC? --     Constitutional: Alert and oriented. Well appearing and in no acute distress. Eyes: Conjunctivae are normal.  Head: Atraumatic. Nose: No congestion/rhinnorhea. Mouth/Throat: Mucous membranes are moist.  Throat appears normal Neck:  supple no lymphadenopathy noted Cardiovascular: Normal rate, regular rhythm. Heart sounds are normal Respiratory: Normal respiratory effort.  No retractions, lungs c t a  Abd: soft nontender bs normal all 4 quad GU: deferred Musculoskeletal: FROM all extremities, warm and well perfused Neurologic:  Normal speech and language.  Skin:  Skin is warm, dry and intact. No  rash noted. Psychiatric: Mood and affect are normal. Speech and behavior are normal.  ____________________________________________   LABS (all labs ordered are listed, but only abnormal results are displayed)  Labs Reviewed - No data to display ____________________________________________   ____________________________________________  RADIOLOGY    ____________________________________________   PROCEDURES  Procedure(s) performed: No  Procedures    ____________________________________________   INITIAL IMPRESSION / ASSESSMENT AND PLAN / ED COURSE  Pertinent labs & imaging results that  were available during my care of the patient were reviewed by me and considered in my medical decision making (see chart for details).   Patient is 27 year old female presents emergency department flulike symptoms.  Physical exam shows patient with hoarse voice and active nasal congestion.  Remainder the exam is unremarkable.  Discussed the flu testing with the patient.  She and I both agree that this time it would not be warranted as it is been more than 4 days that she has been sick.  She was diagnosed with a viral illness and is to follow-up with her regular doctor if not better in 3 to 5 days.  She was given a prescription for Tessalon Perles for cough.  She is to remain out of work until January 1.  She states she understands will comply.  She was discharged in stable condition.     As part of my medical decision making, I reviewed the following data within the electronic MEDICAL RECORD NUMBER Nursing notes reviewed and incorporated, Old chart reviewed, Notes from prior ED visits and [place] Controlled Substance Database  ____________________________________________   FINAL CLINICAL IMPRESSION(S) / ED DIAGNOSES  Final diagnoses:  Influenza-like illness      NEW MEDICATIONS STARTED DURING THIS VISIT:  Discharge Medication List as of 04/05/2018  1:37 PM    START taking these medications   Details  benzonatate (TESSALON) 200 MG capsule Take 1 capsule (200 mg total) by mouth 3 (three) times daily as needed for cough., Starting Mon 04/05/2018, Normal         Note:  This document was prepared using Dragon voice recognition software and may include unintentional dictation errors.    Faythe GheeFisher, Soham Hollett W, PA-C 04/05/18 1609    Arnaldo NatalMalinda, Paul F, MD 04/06/18 [phone]

## 2018-04-05 NOTE — ED Notes (Signed)
Pt c/o sore throat that has been going on since Friday. Pt denies fevers at home, but states she was having cold chills at work earlier. Pt c/o 4/10 pain in her throat. Pt denies any vomiting, but c/o nausea at this time. Pt states she has not been able to eat much this weekend. Pt in NAD at this time.

## 2018-04-05 NOTE — ED Triage Notes (Signed)
C/O fever and chills x 3 days.

## 2019-11-24 ENCOUNTER — Other Ambulatory Visit: Payer: Self-pay

## 2019-11-24 ENCOUNTER — Ambulatory Visit
Admission: EM | Admit: 2019-11-24 | Discharge: 2019-11-24 | Disposition: A | Discharge status: Home / Self Care | Payer: Medicaid Other | Attending: Family | Admitting: Family

## 2019-11-24 DIAGNOSIS — R300 Dysuria: Secondary | ICD-10-CM | POA: Insufficient documentation

## 2019-11-24 DIAGNOSIS — N3001 Acute cystitis with hematuria: Secondary | ICD-10-CM | POA: Insufficient documentation

## 2019-11-24 DIAGNOSIS — R350 Frequency of micturition: Secondary | ICD-10-CM | POA: Insufficient documentation

## 2019-11-24 LAB — URINALYSIS, COMPLETE (UACMP) WITH MICROSCOPIC
Bilirubin Urine: NEGATIVE
Glucose, UA: NEGATIVE mg/dL
Ketones, ur: NEGATIVE mg/dL
Nitrite: POSITIVE — AB
Protein, ur: >300 mg/dL — AB
RBC / HPF: >50 RBC/hpf (ref 0–5)
Specific Gravity, Urine: >1.03 — ABNORMAL HIGH (ref 1.005–1.030)
WBC, UA: >50 WBC/hpf (ref 0–5)
pH: 7 (ref 5.0–8.0)

## 2019-11-24 MED ORDER — PHENAZOPYRIDINE HCL 200 MG PO TABS: 200.0000 mg | ORAL_TABLET | Freq: Three times a day (TID) | ORAL | 0 refills | Status: AC

## 2019-11-24 MED ORDER — NITROFURANTOIN MONOHYD MACRO 100 MG PO CAPS: 100.0000 mg | ORAL_CAPSULE | Freq: Two times a day (BID) | ORAL | 0 refills | Status: AC

## 2019-11-24 NOTE — ED Provider Notes (Signed)
[facility]    CSN: [id number] Arrival date & time: 11/24/19  0950      History   Chief Complaint Chief Complaint  Patient presents with  . Urinary Tract Infection    HPI Krista Rich is a 29 y.o. female.   29 y/o female presents for onset of dysuria, urgency and frequency yesterday morning. She says she also noticed a small amount of "pink" on the toilet paper while wiping today. Patient denies fever, fatigue, weakness, back pain, abdominal pain, vaginal discharge, pelvic pain. Patient not concerned for pregnancy. Has had tubal ligation. Patient denies concerns for STIs as well. Has not taken any OTC medications for symptoms. LMP ~18 days ago. No other concerns today.     Past Medical History:  Diagnosis Date  . Medical history non-contributory     Patient Active Problem List   Diagnosis Date Noted  . PROM (premature rupture of membranes) 05/07/2016  . Dyspareunia in female 02/21/2016  . Supervision of normal intrauterine pregnancy in multigravida in second trimester 01/24/2016  . Tobacco abuse 11/20/2015  . Subchorionic hematoma in first trimester, antepartum 11/20/2015    Past Surgical History:  Procedure Laterality Date  . NO PAST SURGERIES    . TUBAL LIGATION Bilateral 05/09/2016   Procedure: BILATERAL TUBAL LIGATION;  Surgeon: [name], MD;  Location: [facility] ORS;  Service: Gynecology;  Laterality: Bilateral;    OB History    Gravida  3   Para  3   Term  2   Preterm  1   AB      Living  3     SAB      TAB      Ectopic      Multiple  0   Live Births  3            Home Medications    Prior to Admission medications   Medication Sig Start Date End Date Taking? Authorizing Provider  benzonatate (TESSALON) 200 MG capsule Take 1 capsule (200 mg total) by mouth 3 (three) times daily as needed for cough. 04/05/18   Faythe Ghee, PA-C    Family History Family History  Problem Relation Age of Onset  . Hyperlipidemia Mother      Social History Social History   Tobacco Use  . Smoking status: Current Some Day Smoker    Packs/day: 0.25    Types: Cigarettes  . Smokeless tobacco: Never Used  Substance Use Topics  . Alcohol use: No  . Drug use: No    Types: Marijuana     Allergies   Patient has no known allergies.   Review of Systems Review of Systems  Constitutional: Negative for chills, fatigue and fever.  Gastrointestinal: Negative for abdominal pain, diarrhea, nausea and vomiting.  Genitourinary: Positive for dysuria, frequency, hematuria and urgency. Negative for decreased urine volume, flank pain, pelvic pain, vaginal bleeding, vaginal discharge and vaginal pain.  Musculoskeletal: Negative for back pain.  Skin: Negative for rash.  Neurological: Negative for dizziness and weakness.     Physical Exam Triage Vital Signs ED Triage Vitals  Enc Vitals Group     BP 11/24/19 1106 113/68     Pulse Rate 11/24/19 1106 70     Resp 11/24/19 1106 18     Temp 11/24/19 1106 98.4 F (36.9 C)     Temp Source 11/24/19 1106 Oral     SpO2 11/24/19 1106 100 %     Weight 11/24/19 1107 120 lb (54.4  kg)     Height 11/24/19 1107 5\' 5"  (1.651 m)     Head Circumference --      Peak Flow --      Pain Score 11/24/19 1106 8     Pain Loc --      Pain Edu? --      Excl. in GC? --    No data found.  Updated Vital Signs BP 113/68 (BP Location: Left Arm)   Pulse 70   Temp 98.4 F (36.9 C) (Oral)   Resp 18   Ht 5\' 5"  (1.651 m)   Wt 120 lb (54.4 kg)   LMP 11/06/2019   SpO2 100%   BMI 19.97 kg/m      Physical Exam Vitals and nursing note reviewed.  Constitutional:      General: She is not in acute distress.    Appearance: Normal appearance. She is not ill-appearing or toxic-appearing.  HENT:     Head: Normocephalic and atraumatic.  Eyes:     General: No scleral icterus.       Right eye: No discharge.        Left eye: No discharge.     Conjunctiva/sclera: Conjunctivae normal.  Cardiovascular:      Rate and Rhythm: Normal rate and regular rhythm.     Heart sounds: Normal heart sounds.  Pulmonary:     Effort: Pulmonary effort is normal. No respiratory distress.     Breath sounds: Normal breath sounds.  Abdominal:     Palpations: Abdomen is soft.     Tenderness: There is no abdominal tenderness. There is no right CVA tenderness or left CVA tenderness.  Musculoskeletal:     Cervical back: Neck supple.  Skin:    General: Skin is dry.  Neurological:     General: No focal deficit present.     Mental Status: She is alert. Mental status is at baseline.     Motor: No weakness.     Gait: Gait normal.  Psychiatric:        Mood and Affect: Mood normal.        Behavior: Behavior normal.        Thought Content: Thought content normal.      UC Treatments / Results  Labs (all labs ordered are listed, but only abnormal results are displayed) Labs Reviewed  URINALYSIS, COMPLETE (UACMP) WITH MICROSCOPIC    EKG   Radiology No results found.  Procedures Procedures (including critical care time)  Medications Ordered in UC Medications - No data to display  Initial Impression / Assessment and Plan / UC Course  I have reviewed the triage vital signs and the nursing notes.  Pertinent labs & imaging results that were available during my care of the patient were reviewed by me and considered in my medical decision making (see chart for details).   29 y/o female presenting for dysuria, urgency and frequency since yesterday. UA obtained today which reveals: +leuks, +RBCs, +nitrites  Will send urine for culture. Patient has no allergies. Treating for suspected UTI with Macrobid and Pyridium.   ED precautions discussed.  Final Clinical Impressions(s) / UC Diagnoses   Final diagnoses:  Dysuria  Urinary frequency  Acute cystitis with hematuria     Discharge Instructions     UTI: Based on either symptoms or urinalysis, you may have a urinary tract infection. We will send the  urine for culture and call with results in a few days. Begin antibiotics at this time. Your symptoms should  be much improved over the next 2-3 days. Increase rest and fluid intake. If for some reason symptoms are worsening or not improving after a couple of days or the urine culture determines the antibiotics you are taking will not treat the infection, the antibiotics may be changed. Return or go to ER for fever, back pain, worsening urinary pain, discharge, increased blood in urine. May take Tylenol or Motrin OTC for pain relief or consider AZO if no contraindications     ED Prescriptions    None     PDMP not reviewed this encounter.   Shirlee Latch, PA-C 11/24/19 1138

## 2019-11-24 NOTE — Discharge Instructions (Addendum)

## 2019-11-24 NOTE — ED Triage Notes (Signed)
Patient in today w/ c/o UTI sx- burning and frequency. Patient denies fever or flank pain. Sx onset yesterday a.m.

## 2019-11-27 LAB — URINE CULTURE: Culture: >=100000 — AB

## 2020-12-09 ENCOUNTER — Emergency Department
Admission: EM | Admit: 2020-12-09 | Discharge: 2020-12-09 | Disposition: A | Discharge status: Home / Self Care | Payer: 59 | Attending: Emergency Medicine | Admitting: Emergency Medicine

## 2020-12-09 ENCOUNTER — Other Ambulatory Visit: Payer: Self-pay

## 2020-12-09 ENCOUNTER — Emergency Department: Payer: 59

## 2020-12-09 DIAGNOSIS — R197 Diarrhea, unspecified: Secondary | ICD-10-CM | POA: Insufficient documentation

## 2020-12-09 DIAGNOSIS — K2900 Acute gastritis without bleeding: Secondary | ICD-10-CM | POA: Insufficient documentation

## 2020-12-09 DIAGNOSIS — F17210 Nicotine dependence, cigarettes, uncomplicated: Secondary | ICD-10-CM | POA: Insufficient documentation

## 2020-12-09 DIAGNOSIS — R1013 Epigastric pain: Secondary | ICD-10-CM

## 2020-12-09 LAB — CBC
HCT: 33.2 % — ABNORMAL LOW (ref 36.0–46.0)
Hemoglobin: 10.7 g/dL — ABNORMAL LOW (ref 12.0–15.0)
MCH: 21.6 pg — ABNORMAL LOW (ref 26.0–34.0)
MCHC: 32.2 g/dL (ref 30.0–36.0)
MCV: 67.1 fL — ABNORMAL LOW (ref 80.0–100.0)
Platelets: 273 10*3/uL (ref 150–400)
RBC: 4.95 MIL/uL (ref 3.87–5.11)
RDW: 18.6 % — ABNORMAL HIGH (ref 11.5–15.5)
WBC: 4 10*3/uL (ref 4.0–10.5)
nRBC: 0 % (ref 0.0–0.2)

## 2020-12-09 LAB — COMPREHENSIVE METABOLIC PANEL
ALT: 10 U/L (ref 0–44)
AST: 20 U/L (ref 15–41)
Albumin: 4.5 g/dL (ref 3.5–5.0)
Alkaline Phosphatase: 40 U/L (ref 38–126)
Anion gap: 9 (ref 5–15)
BUN: 11 mg/dL (ref 6–20)
CO2: 24 mmol/L (ref 22–32)
Calcium: 9.2 mg/dL (ref 8.9–10.3)
Chloride: 107 mmol/L (ref 98–111)
Creatinine, Ser: 0.71 mg/dL (ref 0.44–1.00)
GFR, Estimated: >60 mL/min (ref >60)
Glucose, Bld: 94 mg/dL (ref 70–99)
Potassium: 3.8 mmol/L (ref 3.5–5.1)
Sodium: 140 mmol/L (ref 135–145)
Total Bilirubin: 0.4 mg/dL (ref 0.3–1.2)
Total Protein: 7.3 g/dL (ref 6.5–8.1)

## 2020-12-09 LAB — URINALYSIS, COMPLETE (UACMP) WITH MICROSCOPIC
Bilirubin Urine: NEGATIVE
Glucose, UA: NEGATIVE mg/dL
Hgb urine dipstick: NEGATIVE
Ketones, ur: NEGATIVE mg/dL
Leukocytes,Ua: NEGATIVE
Nitrite: NEGATIVE
Protein, ur: NEGATIVE mg/dL
Specific Gravity, Urine: 1.025 (ref 1.005–1.030)
pH: 6.5 (ref 5.0–8.0)

## 2020-12-09 LAB — LIPASE, BLOOD: Lipase: 34 U/L (ref 11–51)

## 2020-12-09 LAB — POC URINE PREG, ED: Preg Test, Ur: NEGATIVE

## 2020-12-09 MED ORDER — OMEPRAZOLE MAGNESIUM 20 MG PO TBEC: 20.0000 mg | DELAYED_RELEASE_TABLET | Freq: Every day | ORAL | 1 refills | Status: AC

## 2020-12-09 MED ORDER — LIDOCAINE VISCOUS HCL 2 % MT SOLN
15.0000 mL | Freq: Once | OROMUCOSAL | Status: AC
  Administered 2020-12-09: 15 mL via ORAL
  Filled 2020-12-09: qty 15

## 2020-12-09 MED ORDER — ALUM & MAG HYDROXIDE-SIMETH 200-200-20 MG/5ML PO SUSP
15.0000 mL | Freq: Once | ORAL | Status: AC
  Administered 2020-12-09: 15 mL via ORAL
  Filled 2020-12-09: qty 30

## 2020-12-09 NOTE — ED Provider Notes (Signed)
[facility] Emergency Department Provider Note   ____________________________________________   Event Date/Time   First MD Initiated Contact with Patient 12/09/20 [phone]     (approximate)  I have reviewed the triage vital signs and the nursing notes.   HISTORY  Chief Complaint Abdominal Pain    HPI Krista Rich is a 30 y.o. female with no significant past medical history who presents to the ED complaining of abdominal pain.  Patient reports that she has had about 24 hours of constant pain in her epigastrium described as feeling like a "knot."  This is been associated with nausea and patient made herself vomit this morning to see if it would help, but she did not have any relief of her symptoms.  She does report some diarrhea, denies fevers, dysuria, hematuria, vaginal bleeding, or discharge.  Her LMP was approximately 2 weeks ago.  She describes having similar symptoms when she was younger and was told they were related to gas.  She denies any prior abdominal surgeries.        Past Medical History:  Diagnosis Date   Medical history non-contributory     Patient Active Problem List   Diagnosis Date Noted   PROM (premature rupture of membranes) 05/07/2016   Dyspareunia in female 02/21/2016   Supervision of normal intrauterine pregnancy in multigravida in second trimester 01/24/2016   Tobacco abuse 11/20/2015   Subchorionic hematoma in first trimester, antepartum 11/20/2015    Past Surgical History:  Procedure Laterality Date   NO PAST SURGERIES     TUBAL LIGATION Bilateral 05/09/2016   Procedure: BILATERAL TUBAL LIGATION;  Surgeon: [name], MD;  Location: [facility] ORS;  Service: Gynecology;  Laterality: Bilateral;    Prior to Admission medications   Medication Sig Start Date End Date Taking? Authorizing Provider  omeprazole (PRILOSEC OTC) 20 MG tablet Take 1 tablet (20 mg total) by mouth daily. 12/09/20 12/09/21 Yes Chesley Noon, MD  benzonatate  (TESSALON) 200 MG capsule Take 1 capsule (200 mg total) by mouth 3 (three) times daily as needed for cough. 04/05/18   Faythe Ghee, PA-C    Allergies Patient has no known allergies.  Family History  Problem Relation Age of Onset   Hyperlipidemia Mother     Social History Social History   Tobacco Use   Smoking status: Some Days    Packs/day: 0.25    Types: Cigarettes   Smokeless tobacco: Never  Substance Use Topics   Alcohol use: No   Drug use: No    Types: Marijuana    Review of Systems  Constitutional: No fever/chills Eyes: No visual changes. ENT: No sore throat. Cardiovascular: Denies chest pain. Respiratory: Denies shortness of breath. Gastrointestinal: Positive for abdominal pain and nausea, no vomiting.  Positive for diarrhea.  No constipation. Genitourinary: Negative for dysuria. Musculoskeletal: Negative for back pain. Skin: Negative for rash. Neurological: Negative for headaches, focal weakness or numbness.  ____________________________________________   PHYSICAL EXAM:  VITAL SIGNS: ED Triage Vitals [12/09/20 0710]  Enc Vitals Group     BP 107/75     Pulse Rate 61     Resp 18     Temp 97.9 F (36.6 C)     Temp Source Oral     SpO2 99 %     Weight 120 lb (54.4 kg)     Height 5\' 5"  (1.651 m)     Head Circumference      Peak Flow      Pain Score 8  Pain Loc      Pain Edu?      Excl. in GC?     Constitutional: Alert and oriented. Eyes: Conjunctivae are normal. Head: Atraumatic. Nose: No congestion/rhinnorhea. Mouth/Throat: Mucous membranes are moist. Neck: Normal ROM Cardiovascular: Normal rate, regular rhythm. Grossly normal heart sounds.  2+ radial pulses bilaterally. Respiratory: Normal respiratory effort.  No retractions. Lungs CTAB. Gastrointestinal: Soft and mildly tender to palpation in the epigastrium with no rebound or guarding. No distention. Genitourinary: deferred Musculoskeletal: No lower extremity tenderness nor  edema. Neurologic:  Normal speech and language. No gross focal neurologic deficits are appreciated. Skin:  Skin is warm, dry and intact. No rash noted. Psychiatric: Mood and affect are normal. Speech and behavior are normal.  ____________________________________________   LABS (all labs ordered are listed, but only abnormal results are displayed)  Labs Reviewed  CBC - Abnormal; Notable for the following components:      Result Value   Hemoglobin 10.7 (*)    HCT 33.2 (*)    MCV 67.1 (*)    MCH 21.6 (*)    RDW 18.6 (*)    All other components within normal limits  URINALYSIS, COMPLETE (UACMP) WITH MICROSCOPIC - Abnormal; Notable for the following components:   Bacteria, UA RARE (*)    All other components within normal limits  LIPASE, BLOOD  COMPREHENSIVE METABOLIC PANEL  POC URINE PREG, ED    PROCEDURES  Procedure(s) performed (including Critical Care):  Procedures   ____________________________________________   INITIAL IMPRESSION / ASSESSMENT AND PLAN / ED COURSE      30 year old female with no significant past medical history presents to the ED with 24 hours of a knot-like feeling in her epigastrium associate with nausea and diarrhea.  Differential includes cholecystitis, biliary colic, gastritis, peptic ulcer disease, and gastroenteritis.  Labs are thus far reassuring, LFTs and lipase within normal limits.  We will further assess with right upper quadrant ultrasound, treat symptomatically with GI cocktail and reassess.  Pregnancy testing is negative and UA shows no signs of infection.  Right upper quadrant ultrasound is unremarkable, no evidence of gallstones or other biliary process.  Patient reports feeling better following GI cocktail and she is appropriate for discharge home with PCP follow-up.  We will start her on omeprazole and she was counseled to use Tums or Pepcid as needed.  She was counseled to return to the ED for new worsening symptoms, patient agrees with  plan.      ____________________________________________   FINAL CLINICAL IMPRESSION(S) / ED DIAGNOSES  Final diagnoses:  Epigastric pain  Acute gastritis without hemorrhage, unspecified gastritis type     ED Discharge Orders          Ordered    omeprazole (PRILOSEC OTC) 20 MG tablet  Daily        12/09/20 1051             Note:  This document was prepared using Dragon voice recognition software and may include unintentional dictation errors.    Chesley Noon, MD 12/09/20 1052

## 2020-12-09 NOTE — ED Triage Notes (Signed)
Pt here with abd pain that started yesterday. Pt states that she has been nauseous and having diarrhea as well. Pain is centered and does not radiate.

## 2022-07-07 IMAGING — US US ABDOMEN LIMITED
1 series · 14 of 25 positions shown · non-contrast
Comparison: None.

CLINICAL DATA: Epigastric abdominal pain, nausea and vomiting since
yesterday

EXAM:
ULTRASOUND ABDOMEN LIMITED RIGHT UPPER QUADRANT

[Series 1: us abdomen limited ruq (liver/gb) · 14 of 33 slices shown]
[im 1/33]
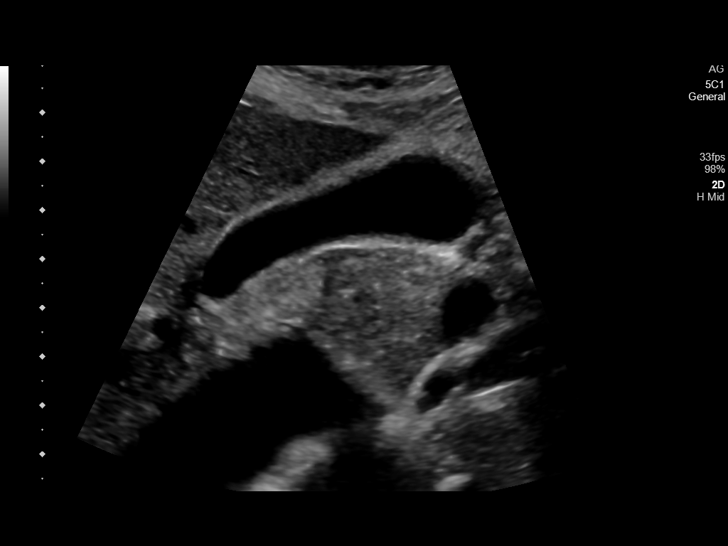
[im 3/33]
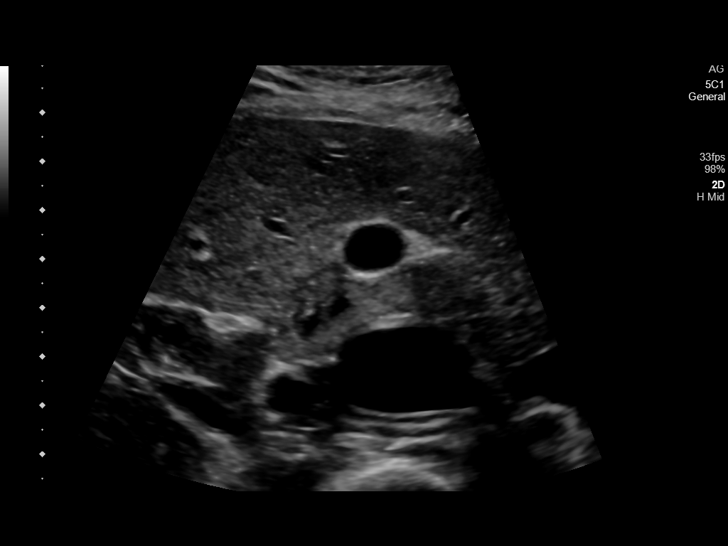
[im 6/33]
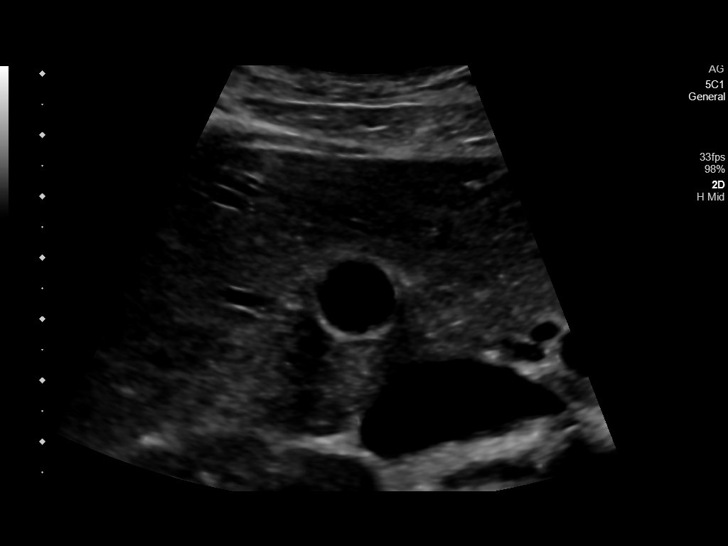
[im 9/33]
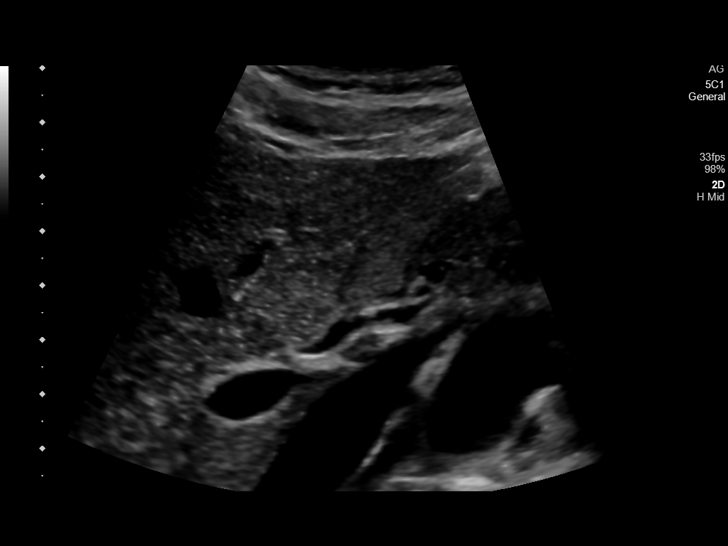
[im 11/33]
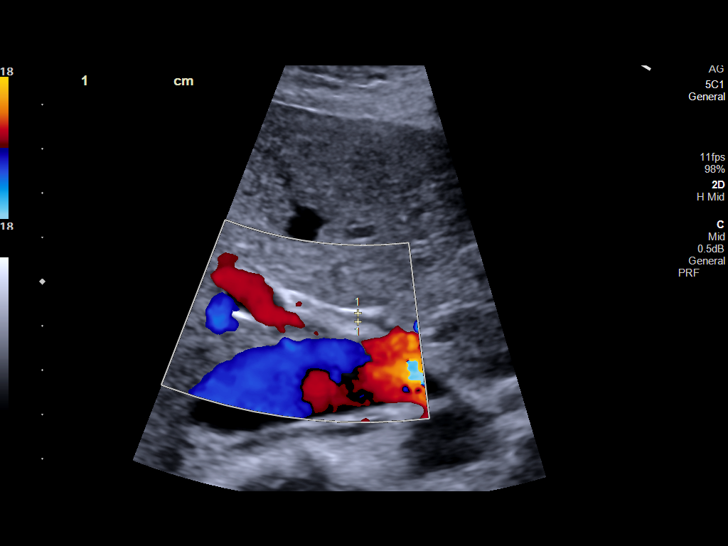
[im 13/33]
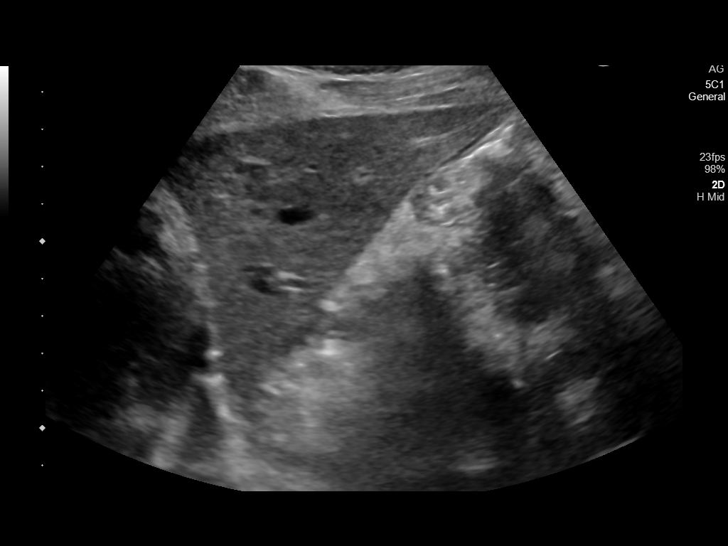
[im 15/33]
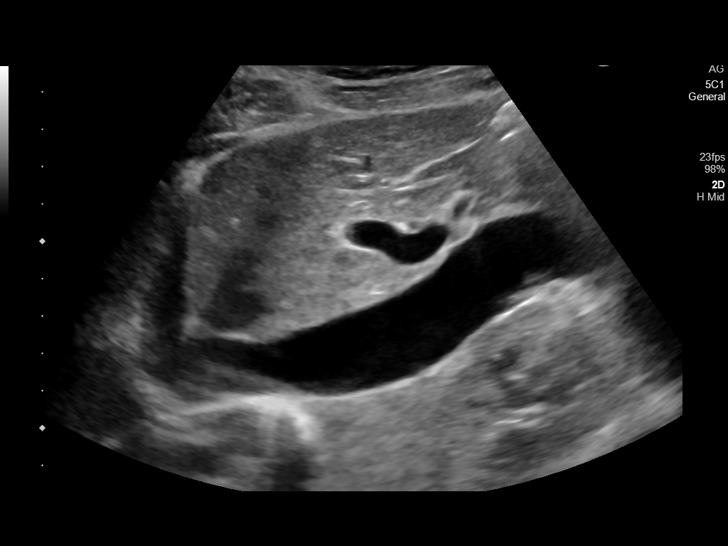
[im 18/33]
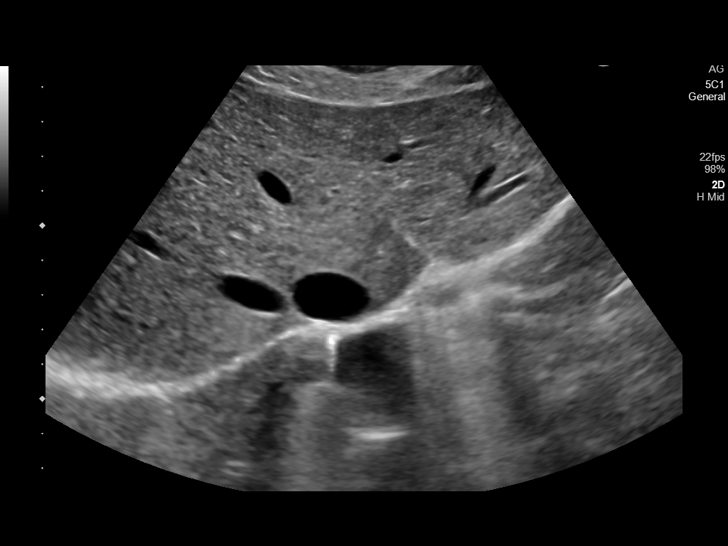
[im 21/33]
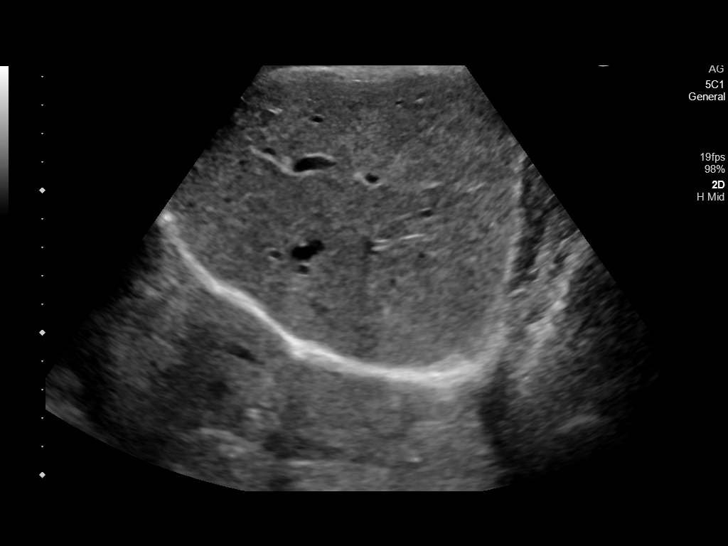
[im 22/33]
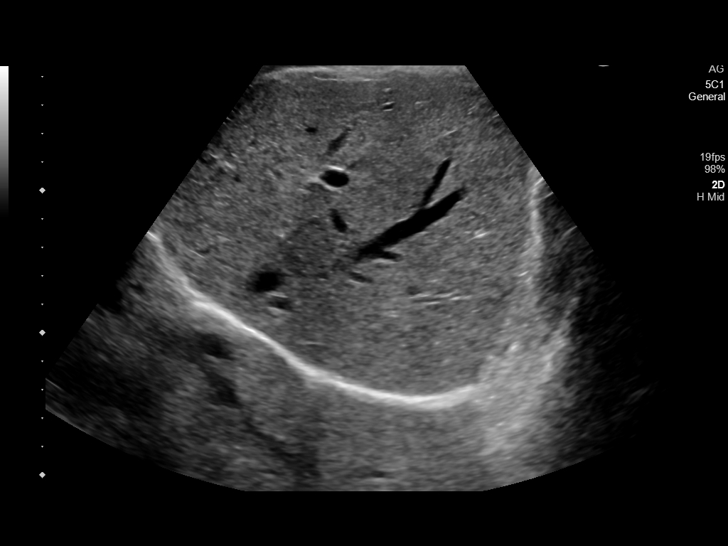
[im 25/33]
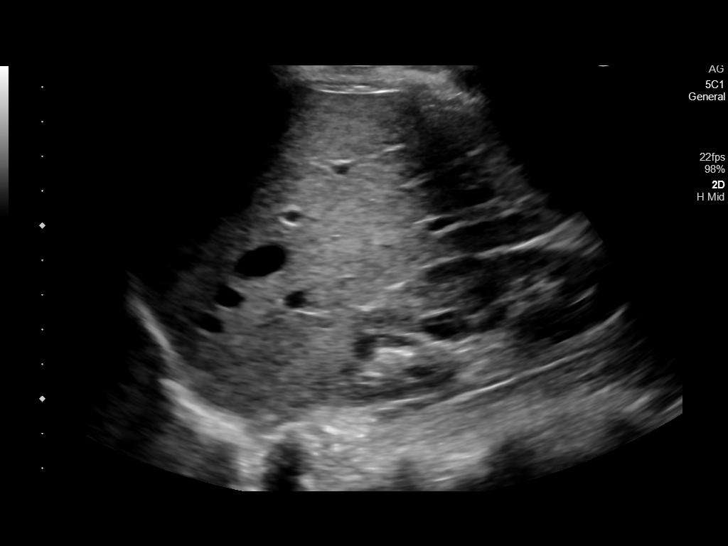
[im 27/33]
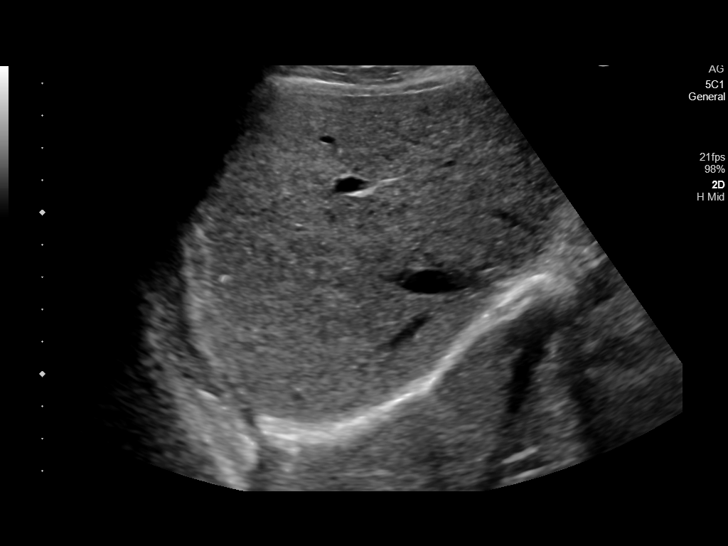
[im 30/33]
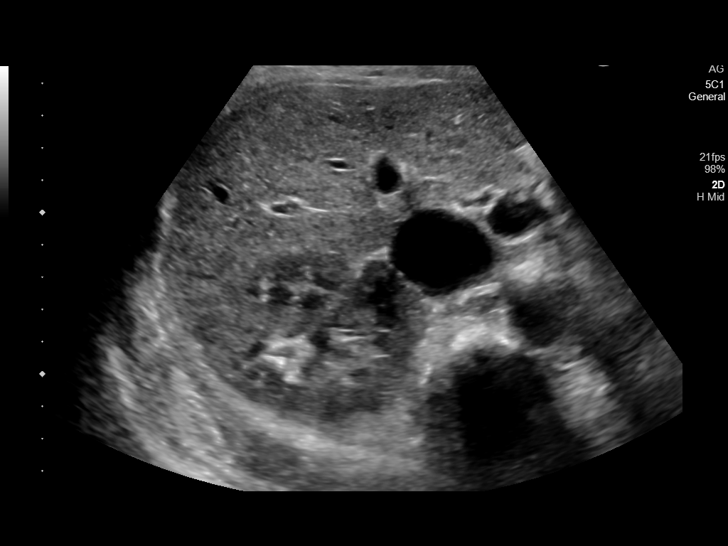
[im 33/33]
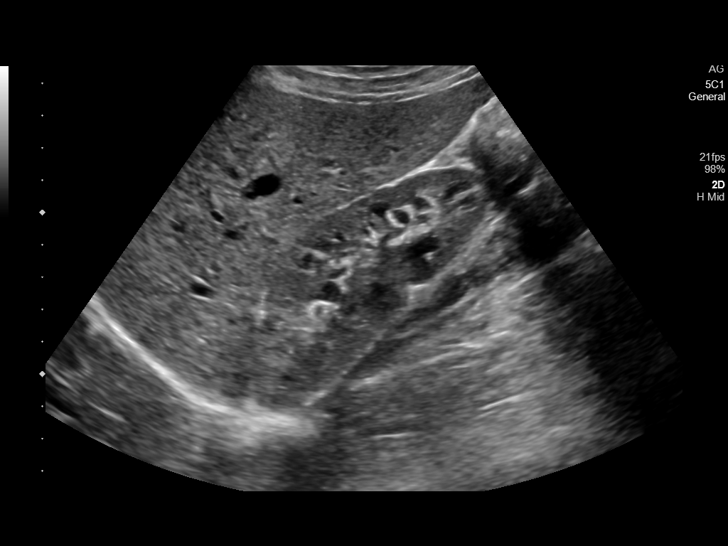

[14 of 25 positions shown; findings below may reference images not displayed]

FINDINGS: Gallbladder:

No gallstones or wall thickening visualized. No sonographic Murphy
sign noted by sonographer.

Common bile duct:

Diameter: 2 mm

Liver:

No focal lesion identified. Within normal limits in parenchymal
echogenicity. Portal vein is patent on color Doppler imaging with
normal direction of blood flow towards the liver.

Other: None.
IMPRESSION: Normal right upper quadrant abdominal sonogram.  No cholelithiasis.

## 2022-10-06 DIAGNOSIS — L03032 Cellulitis of left toe: Secondary | ICD-10-CM | POA: Diagnosis not present

## 2022-11-10 ENCOUNTER — Emergency Department
Admission: EM | Admit: 2022-11-10 | Discharge: 2022-11-10 | Disposition: A | Discharge status: Home / Self Care | Payer: Medicaid Other | Attending: Emergency Medicine | Admitting: Emergency Medicine

## 2022-11-10 ENCOUNTER — Encounter: Payer: Self-pay | Admitting: Emergency Medicine

## 2022-11-10 ENCOUNTER — Emergency Department: Payer: Medicaid Other

## 2022-11-10 ENCOUNTER — Other Ambulatory Visit: Payer: Self-pay

## 2022-11-10 DIAGNOSIS — H53149 Visual discomfort, unspecified: Secondary | ICD-10-CM | POA: Diagnosis not present

## 2022-11-10 DIAGNOSIS — R110 Nausea: Secondary | ICD-10-CM | POA: Insufficient documentation

## 2022-11-10 DIAGNOSIS — R519 Headache, unspecified: Secondary | ICD-10-CM | POA: Insufficient documentation

## 2022-11-10 LAB — CBC WITH DIFFERENTIAL/PLATELET
Abs Immature Granulocytes: 0.01 10*3/uL (ref 0.00–0.07)
Basophils Absolute: 0 10*3/uL (ref 0.0–0.1)
Basophils Relative: 1 %
Eosinophils Absolute: 0 10*3/uL (ref 0.0–0.5)
Eosinophils Relative: 0 %
HCT: 29.7 % — ABNORMAL LOW (ref 36.0–46.0)
Hemoglobin: 9.2 g/dL — ABNORMAL LOW (ref 12.0–15.0)
Immature Granulocytes: 0 %
Lymphocytes Relative: 30 %
Lymphs Abs: 1.4 10*3/uL (ref 0.7–4.0)
MCH: 19.4 pg — ABNORMAL LOW (ref 26.0–34.0)
MCHC: 31 g/dL (ref 30.0–36.0)
MCV: 62.5 fL — ABNORMAL LOW (ref 80.0–100.0)
Monocytes Absolute: 0.4 10*3/uL (ref 0.1–1.0)
Monocytes Relative: 9 %
Neutro Abs: 2.8 10*3/uL (ref 1.7–7.7)
Neutrophils Relative %: 60 %
Platelets: 272 10*3/uL (ref 150–400)
RBC: 4.75 MIL/uL (ref 3.87–5.11)
RDW: 19.3 % — ABNORMAL HIGH (ref 11.5–15.5)
WBC: 4.6 10*3/uL (ref 4.0–10.5)
nRBC: 0 % (ref 0.0–0.2)

## 2022-11-10 LAB — COMPREHENSIVE METABOLIC PANEL
ALT: 18 U/L (ref 0–44)
AST: 25 U/L (ref 15–41)
Albumin: 4.4 g/dL (ref 3.5–5.0)
Alkaline Phosphatase: 39 U/L (ref 38–126)
Anion gap: 9 (ref 5–15)
BUN: 13 mg/dL (ref 6–20)
CO2: 24 mmol/L (ref 22–32)
Calcium: 9 mg/dL (ref 8.9–10.3)
Chloride: 106 mmol/L (ref 98–111)
Creatinine, Ser: 0.7 mg/dL (ref 0.44–1.00)
GFR, Estimated: >60 mL/min (ref >60)
Glucose, Bld: 98 mg/dL (ref 70–99)
Potassium: 3.7 mmol/L (ref 3.5–5.1)
Sodium: 139 mmol/L (ref 135–145)
Total Bilirubin: 0.2 mg/dL — ABNORMAL LOW (ref 0.3–1.2)
Total Protein: 7.2 g/dL (ref 6.5–8.1)

## 2022-11-10 MED ORDER — BUTALBITAL-APAP-CAFFEINE 50-325-40 MG PO TABS: 1.0000 | ORAL_TABLET | ORAL | 0 refills | Status: AC | PRN

## 2022-11-10 MED ORDER — ONDANSETRON HCL 4 MG/2ML IJ SOLN
4.0000 mg | Freq: Once | INTRAMUSCULAR | Status: AC
  Administered 2022-11-10: 4 mg via INTRAVENOUS
  Filled 2022-11-10: qty 2

## 2022-11-10 MED ORDER — SODIUM CHLORIDE 0.9 % IV BOLUS
1000.0000 mL | Freq: Once | INTRAVENOUS | Status: AC
  Administered 2022-11-10: 1000 mL via INTRAVENOUS

## 2022-11-10 MED ORDER — KETOROLAC TROMETHAMINE 30 MG/ML IJ SOLN
15.0000 mg | Freq: Once | INTRAMUSCULAR | Status: AC
  Administered 2022-11-10: 15 mg via INTRAVENOUS
  Filled 2022-11-10: qty 1

## 2022-11-10 NOTE — ED Triage Notes (Signed)
Pt presents ambulatory to triage via POV with complaints of headache x 2 days. She notes taking OTC medication without improvement. No other associated sx. A&Ox4 at this time. Denies cough, chills, running nose, vision changes, dizziness, CP or SOB.

## 2022-11-10 NOTE — Discharge Instructions (Addendum)
You may take Fioricet as needed for pain.  Return to the ER for worsening symptoms, persistent vomiting, lethargy or other concerns.

## 2022-11-10 NOTE — ED Provider Notes (Signed)
[facility] Provider Note    Event Date/Time   First MD Initiated Contact with Patient 11/10/22 0116     (approximate)   History   Headache   HPI  Krista Rich is a 32 y.o. female who presents to the ED from home with a chief complaint of headache.  Patient was sitting outside accompany her child at a birthday party 2 days ago.  By the end of the party she had developed a left parietal headache.  Took over-the-counter medications without improvement in pain.  Endorses associated photophobia and nausea without vomiting.  Denies fever/chills, vision changes, neck pain, chest pain, shortness of breath, abdominal pain, dizziness.     Past Medical History   Past Medical History:  Diagnosis Date   Medical history non-contributory      Active Problem List   Patient Active Problem List   Diagnosis Date Noted   PROM (premature rupture of membranes) 05/07/2016   Dyspareunia in female 02/21/2016   Supervision of normal intrauterine pregnancy in multigravida in second trimester 01/24/2016   Tobacco abuse 11/20/2015   Subchorionic hematoma in first trimester, antepartum 11/20/2015     Past Surgical History   Past Surgical History:  Procedure Laterality Date   NO PAST SURGERIES     TUBAL LIGATION Bilateral 05/09/2016   Procedure: BILATERAL TUBAL LIGATION;  Surgeon: [name], MD;  Location: [facility] ORS;  Service: Gynecology;  Laterality: Bilateral;     Home Medications   Prior to Admission medications   Medication Sig Start Date End Date Taking? Authorizing Provider  benzonatate (TESSALON) 200 MG capsule Take 1 capsule (200 mg total) by mouth 3 (three) times daily as needed for cough. 04/05/18   Fisher, Roselyn Bering, PA-C  omeprazole (PRILOSEC OTC) 20 MG tablet Take 1 tablet (20 mg total) by mouth daily. 12/09/20 12/09/21  Chesley Noon, MD     Allergies  Patient has no known allergies.   Family History   Family History  Problem Relation Age of  Onset   Hyperlipidemia Mother    No history of migraines or cerebral aneurysms  Physical Exam  Triage Vital Signs: ED Triage Vitals  Encounter Vitals Group     BP 11/10/22 0045 122/84     Systolic BP Percentile --      Diastolic BP Percentile --      Pulse Rate 11/10/22 0045 72     Resp 11/10/22 0045 18     Temp 11/10/22 0045 98.3 F (36.8 C)     Temp Source 11/10/22 0045 Oral     SpO2 11/10/22 0045 100 %     Weight 11/10/22 0044 120 lb (54.4 kg)     Height 11/10/22 0044 5\' 5"  (1.651 m)     Head Circumference --      Peak Flow --      Pain Score 11/10/22 0045 6     Pain Loc --      Pain Education --      Exclude from Growth Chart --     Updated Vital Signs: BP 122/84   Pulse 72   Temp 98.3 F (36.8 C) (Oral)   Resp 18   Ht 5\' 5"  (1.651 m)   Wt 54.4 kg   LMP 11/03/2022 (Approximate)   SpO2 100%   BMI 19.97 kg/m    General: Awake, no distress.  CV:  RRR.  Good peripheral perfusion.  Resp:  Normal effort.  CTAB. Abd:  No distention.  Other:  PERRL.  EOMI.  Supple neck without meningismus.  No carotid bruits.  Alert and oriented x 3.  CN II-XII grossly intact.  5/5 motor strength and sensation all extremities.  MAE x 4.  No petechiae.   ED Results / Procedures / Treatments  Labs (all labs ordered are listed, but only abnormal results are displayed) Labs Reviewed  CBC WITH DIFFERENTIAL/PLATELET - Abnormal; Notable for the following components:      Result Value   Hemoglobin 9.2 (*)    HCT 29.7 (*)    MCV 62.5 (*)    MCH 19.4 (*)    RDW 19.3 (*)    All other components within normal limits  COMPREHENSIVE METABOLIC PANEL - Abnormal; Notable for the following components:   Total Bilirubin 0.2 (*)    All other components within normal limits     EKG  None   RADIOLOGY I have independently visualized and interpreted patient's CT scan as well as noted the radiology interpretation:  CT head: Negative  Official radiology report(s): CT Head Wo  Contrast  Result Date: 11/10/2022 CLINICAL DATA:  Headache, increasing frequency or severity EXAM: CT HEAD WITHOUT CONTRAST TECHNIQUE: Contiguous axial images were obtained from the base of the skull through the vertex without intravenous contrast. RADIATION DOSE REDUCTION: This exam was performed according to the departmental dose-optimization program which includes automated exposure control, adjustment of the mA and/or kV according to patient size and/or use of iterative reconstruction technique. COMPARISON:  None Available. FINDINGS: Brain: No acute intracranial abnormality. Specifically, no hemorrhage, hydrocephalus, mass lesion, acute infarction, or significant intracranial injury. Vascular: No hyperdense vessel or unexpected calcification. Skull: No acute calvarial abnormality. Sinuses/Orbits: No acute findings Other: None IMPRESSION: Normal study. Electronically Signed   By: Charlett Nose M.D.   On: 11/10/2022 01:51     PROCEDURES:  Critical Care performed: No  Procedures   MEDICATIONS ORDERED IN ED: Medications  sodium chloride 0.9 % bolus 1,000 mL (1,000 mLs Intravenous New Bag/Given 11/10/22 0134)  ondansetron (ZOFRAN) injection 4 mg (4 mg Intravenous Given 11/10/22 0136)  ketorolac (TORADOL) 30 MG/ML injection 15 mg (15 mg Intravenous Given 11/10/22 0136)     IMPRESSION / MDM / ASSESSMENT AND PLAN / ED COURSE  I reviewed the triage vital signs and the nursing notes.                             32 year old female presenting with headache. Differential diagnosis includes, but is not limited to, intracranial hemorrhage, meningitis/encephalitis, previous head trauma, cavernous venous thrombosis, tension headache, temporal arteritis, migraine or migraine equivalent, idiopathic intracranial hypertension, and non-specific headache.  I personally reviewed patient's records and note a podiatry office visit on 10/06/2022 for paronychia.  Patient's presentation is most consistent with acute  complicated illness / injury requiring diagnostic workup.  Well-appearing patient without focal neurological deficits.  Will obtain CT head, initiate IV fluid hydration, IV Ketorolac as patient is driving.  Will reassess.  Clinical Course as of 11/10/22 0255  Mon Nov 10, 2022  0255 Headache significantly improved.  Remains neurologically intact without focal deficits.  Will discharge home with as needed Fioricet and referred to neurology for outpatient follow-up.  Strict return precautions given.  Patient verbalizes understanding and agrees with plan of care. [JS]    Clinical Course User Index [JS] Irean Hong, MD     FINAL CLINICAL IMPRESSION(S) / ED DIAGNOSES   Final diagnoses:  Bad headache  Rx / DC Orders   ED Discharge Orders     None        Note:  This document was prepared using Dragon voice recognition software and may include unintentional dictation errors.   Irean Hong, MD 11/10/22 [phone]

## 2022-12-31 DIAGNOSIS — Z30430 Encounter for insertion of intrauterine contraceptive device: Secondary | ICD-10-CM | POA: Diagnosis not present

## 2022-12-31 DIAGNOSIS — Z1159 Encounter for screening for other viral diseases: Secondary | ICD-10-CM | POA: Diagnosis not present

## 2022-12-31 DIAGNOSIS — N882 Stricture and stenosis of cervix uteri: Secondary | ICD-10-CM | POA: Diagnosis not present

## 2022-12-31 DIAGNOSIS — B9689 Other specified bacterial agents as the cause of diseases classified elsewhere: Secondary | ICD-10-CM | POA: Diagnosis not present

## 2022-12-31 DIAGNOSIS — N760 Acute vaginitis: Secondary | ICD-10-CM | POA: Diagnosis not present

## 2023-12-18 DIAGNOSIS — G4700 Insomnia, unspecified: Secondary | ICD-10-CM | POA: Diagnosis not present

## 2023-12-18 DIAGNOSIS — F419 Anxiety disorder, unspecified: Secondary | ICD-10-CM | POA: Diagnosis not present

## 2024-01-08 DIAGNOSIS — Z131 Encounter for screening for diabetes mellitus: Secondary | ICD-10-CM | POA: Diagnosis not present

## 2024-01-08 DIAGNOSIS — Z1321 Encounter for screening for nutritional disorder: Secondary | ICD-10-CM | POA: Diagnosis not present

## 2024-01-08 DIAGNOSIS — Z1329 Encounter for screening for other suspected endocrine disorder: Secondary | ICD-10-CM | POA: Diagnosis not present

## 2024-01-08 DIAGNOSIS — Z13220 Encounter for screening for lipoid disorders: Secondary | ICD-10-CM | POA: Diagnosis not present

## 2024-01-08 DIAGNOSIS — F419 Anxiety disorder, unspecified: Secondary | ICD-10-CM | POA: Diagnosis not present

## 2024-01-08 DIAGNOSIS — Z13228 Encounter for screening for other metabolic disorders: Secondary | ICD-10-CM | POA: Diagnosis not present

## 2024-01-08 DIAGNOSIS — F900 Attention-deficit hyperactivity disorder, predominantly inattentive type: Secondary | ICD-10-CM | POA: Diagnosis not present

## 2024-02-02 DIAGNOSIS — F419 Anxiety disorder, unspecified: Secondary | ICD-10-CM | POA: Diagnosis not present

## 2024-02-02 DIAGNOSIS — F900 Attention-deficit hyperactivity disorder, predominantly inattentive type: Secondary | ICD-10-CM | POA: Diagnosis not present
